# Patient Record
Sex: Female | Born: 1965
Health system: Southern US, Community
[De-identification: ages and names within clinical notes are randomized; demographics above are authoritative.]

## PROBLEM LIST (undated history)

## (undated) DIAGNOSIS — K529 Noninfective gastroenteritis and colitis, unspecified: Secondary | ICD-10-CM

## (undated) DIAGNOSIS — E7800 Pure hypercholesterolemia, unspecified: Secondary | ICD-10-CM

## (undated) DIAGNOSIS — K5100 Ulcerative (chronic) pancolitis without complications: Secondary | ICD-10-CM

## (undated) DIAGNOSIS — K589 Irritable bowel syndrome without diarrhea: Secondary | ICD-10-CM

## (undated) DIAGNOSIS — Z8249 Family history of ischemic heart disease and other diseases of the circulatory system: Secondary | ICD-10-CM

## (undated) HISTORY — DX: Noninfective gastroenteritis and colitis, unspecified: K52.9

## (undated) HISTORY — PX: PARTIAL HYSTERECTOMY: SHX80

## (undated) HISTORY — DX: Family history of ischemic heart disease and other diseases of the circulatory system: Z82.49

## (undated) HISTORY — DX: Ulcerative (chronic) pancolitis without complications: K51.00

## (undated) HISTORY — PX: TONSILLECTOMY: SHX5217

## (undated) HISTORY — DX: Pure hypercholesterolemia, unspecified: E78.00

## (undated) HISTORY — DX: Irritable bowel syndrome, unspecified: K58.9

---

## 1997-08-07 ENCOUNTER — Other Ambulatory Visit: Admission: RE | Admit: 1997-08-07 | Discharge: 1997-08-07 | Payer: Self-pay | Admitting: Obstetrics and Gynecology

## 1998-02-11 ENCOUNTER — Other Ambulatory Visit: Admission: RE | Admit: 1998-02-11 | Discharge: 1998-02-11 | Payer: Self-pay | Admitting: Obstetrics and Gynecology

## 1998-08-13 ENCOUNTER — Other Ambulatory Visit: Admission: RE | Admit: 1998-08-13 | Discharge: 1998-08-13 | Payer: Self-pay | Admitting: Obstetrics and Gynecology

## 1999-01-31 ENCOUNTER — Other Ambulatory Visit: Admission: RE | Admit: 1999-01-31 | Discharge: 1999-01-31 | Payer: Self-pay | Admitting: Obstetrics and Gynecology

## 1999-08-19 ENCOUNTER — Other Ambulatory Visit: Admission: RE | Admit: 1999-08-19 | Discharge: 1999-08-19 | Payer: Self-pay | Admitting: Obstetrics and Gynecology

## 2000-02-05 ENCOUNTER — Other Ambulatory Visit: Admission: RE | Admit: 2000-02-05 | Discharge: 2000-02-05 | Payer: Self-pay | Admitting: Obstetrics and Gynecology

## 2000-03-04 ENCOUNTER — Encounter (INDEPENDENT_AMBULATORY_CARE_PROVIDER_SITE_OTHER): Payer: Self-pay | Admitting: Specialist

## 2000-03-04 ENCOUNTER — Other Ambulatory Visit: Admission: RE | Admit: 2000-03-04 | Discharge: 2000-03-04 | Payer: Self-pay | Admitting: Obstetrics and Gynecology

## 2000-06-17 ENCOUNTER — Encounter (INDEPENDENT_AMBULATORY_CARE_PROVIDER_SITE_OTHER): Payer: Self-pay | Admitting: Specialist

## 2000-06-18 ENCOUNTER — Inpatient Hospital Stay (HOSPITAL_COMMUNITY): Admission: RE | Admit: 2000-06-18 | Discharge: 2000-06-19 | Payer: Self-pay | Admitting: Obstetrics and Gynecology

## 2001-09-06 ENCOUNTER — Other Ambulatory Visit: Admission: RE | Admit: 2001-09-06 | Discharge: 2001-09-06 | Payer: Self-pay | Admitting: Obstetrics and Gynecology

## 2002-10-13 ENCOUNTER — Other Ambulatory Visit: Admission: RE | Admit: 2002-10-13 | Discharge: 2002-10-13 | Payer: Self-pay | Admitting: Obstetrics and Gynecology

## 2003-04-13 ENCOUNTER — Other Ambulatory Visit: Admission: RE | Admit: 2003-04-13 | Discharge: 2003-04-13 | Payer: Self-pay | Admitting: Obstetrics and Gynecology

## 2003-11-27 ENCOUNTER — Other Ambulatory Visit: Admission: RE | Admit: 2003-11-27 | Discharge: 2003-11-27 | Payer: Self-pay | Admitting: Obstetrics and Gynecology

## 2004-06-04 ENCOUNTER — Other Ambulatory Visit: Admission: RE | Admit: 2004-06-04 | Discharge: 2004-06-04 | Payer: Self-pay | Admitting: Obstetrics and Gynecology

## 2005-01-09 ENCOUNTER — Other Ambulatory Visit: Admission: RE | Admit: 2005-01-09 | Discharge: 2005-01-09 | Payer: Self-pay | Admitting: Obstetrics and Gynecology

## 2006-05-04 ENCOUNTER — Ambulatory Visit: Payer: Self-pay | Admitting: Internal Medicine

## 2006-12-01 ENCOUNTER — Ambulatory Visit: Payer: Self-pay | Admitting: Internal Medicine

## 2007-04-01 ENCOUNTER — Encounter: Payer: Self-pay | Admitting: Internal Medicine

## 2007-04-01 DIAGNOSIS — J309 Allergic rhinitis, unspecified: Secondary | ICD-10-CM | POA: Insufficient documentation

## 2007-04-01 DIAGNOSIS — R87619 Unspecified abnormal cytological findings in specimens from cervix uteri: Secondary | ICD-10-CM | POA: Insufficient documentation

## 2007-04-01 DIAGNOSIS — J45909 Unspecified asthma, uncomplicated: Secondary | ICD-10-CM | POA: Insufficient documentation

## 2007-07-29 ENCOUNTER — Encounter: Payer: Self-pay | Admitting: Internal Medicine

## 2008-01-25 ENCOUNTER — Ambulatory Visit: Payer: Self-pay | Admitting: Internal Medicine

## 2008-02-06 ENCOUNTER — Telehealth: Payer: Self-pay | Admitting: Internal Medicine

## 2008-05-07 ENCOUNTER — Ambulatory Visit: Payer: Self-pay | Admitting: Endocrinology

## 2008-05-23 ENCOUNTER — Telehealth: Payer: Self-pay | Admitting: Internal Medicine

## 2008-05-26 ENCOUNTER — Ambulatory Visit: Payer: Self-pay | Admitting: Internal Medicine

## 2008-05-28 ENCOUNTER — Encounter: Admission: RE | Admit: 2008-05-28 | Discharge: 2008-05-28 | Payer: Self-pay | Admitting: Obstetrics and Gynecology

## 2009-02-23 ENCOUNTER — Ambulatory Visit: Payer: Self-pay | Admitting: Family Medicine

## 2009-06-08 ENCOUNTER — Ambulatory Visit: Payer: Self-pay | Admitting: Family Medicine

## 2009-09-16 ENCOUNTER — Encounter: Admission: RE | Admit: 2009-09-16 | Discharge: 2009-09-16 | Payer: Self-pay | Admitting: Obstetrics and Gynecology

## 2009-12-13 ENCOUNTER — Ambulatory Visit: Payer: Self-pay | Admitting: Internal Medicine

## 2010-01-18 ENCOUNTER — Ambulatory Visit: Payer: Self-pay | Admitting: Internal Medicine

## 2010-04-13 ENCOUNTER — Encounter: Payer: Self-pay | Admitting: Obstetrics and Gynecology

## 2010-04-22 NOTE — Assessment & Plan Note (Signed)
Summary: SINUS CONGEST--EAR ACHE--DR AVP PT--STC   Vital Signs:  Patient profile:   45 year old female Weight:      124 pounds Temp:     98.3 degrees F oral BP sitting:   96 / 70  (left arm)  Vitals Entered By: Doristine Devoid (June 08, 2009 10:31 AM) CC: sinus congestion and pressure    History of Present Illness: Acute visit. Patient is seen with several day history of progressive sinus congestion, yellowish nasal discharge, facial pain mostly maxillary sinus region, intermittent sore throat and headache and possible low grade fevers intermittently. Over past few days has developed productive cough. History of frequent sinusitis in past. Over-the-counter medications without much relief. Nonsmoker  Allergies: 1)  ! Hydrocodone 2)  Codeine Phosphate (Codeine Phosphate)  Past History:  Past Medical History: Last updated: 04/01/2007 Allergic rhinitis Asthma PMH reviewed for relevance  Review of Systems      See HPI  Physical Exam  General:  Well-developed,well-nourished,in no acute distress; alert,appropriate and cooperative throughout examination Ears:  External ear exam shows no significant lesions or deformities.  Otoscopic examination reveals clear canals, tympanic membranes are intact bilaterally without bulging, retraction, inflammation or discharge. Hearing is grossly normal bilaterally. Nose:  External nasal examination shows no deformity or inflammation. Nasal mucosa are pink and moist without lesions or exudates. Mouth:  minimal erythema of the uvula otherwise no acute change Neck:  No deformities, masses, or tenderness noted. Lungs:  Normal respiratory effort, chest expands symmetrically. Lungs are clear to auscultation, no crackles or wheezes. Heart:  Normal rate and regular rhythm. S1 and S2 normal without gallop, murmur, click, rub or other extra sounds.   Impression & Recommendations:  Problem # 1:  SINUSITIS- ACUTE-NOS (ICD-461.9)  The following medications  were removed from the medication list:    Amoxicillin 500 Mg Tabs (Amoxicillin) .Marland Kitchen... 2 tab by mouth two times a day  x 10 days  may start if not improving in 2-3 days. void after 03/2009 Her updated medication list for this problem includes:    Amoxicillin-pot Clavulanate 875-125 Mg Tabs (Amoxicillin-pot clavulanate) ..... One by mouth two times a day for 10 days  Complete Medication List: 1)  Ventolin Hfa 108 (90 Base) Mcg/act Aers (Albuterol sulfate) .Marland Kitchen.. 1-2 puffs q6h for wheezing 2)  Amoxicillin-pot Clavulanate 875-125 Mg Tabs (Amoxicillin-pot clavulanate) .... One by mouth two times a day for 10 days  Patient Instructions: 1)  Acute sinusitis symptoms for less than 10 days are not helped by antibiotics. Use warm moist compresses, and over the counter decongestants( only as directed). Call if no improvement in 5-7 days, sooner if increasing pain, fever, or new symptoms.  Prescriptions: AMOXICILLIN-POT CLAVULANATE 875-125 MG TABS (AMOXICILLIN-POT CLAVULANATE) one by mouth two times a day for 10 days  #20 x 0   Entered and Authorized by:   Evelena Peat MD   Signed by:   Evelena Peat MD on 06/08/2009   Method used:   [facility] to        [facility] [address] [phone]* (retail)       [address]       [place], [place]  [address]       Ph: [phone] or [phone]       Fax: [phone]   RxID:   [id number]

## 2010-04-22 NOTE — Assessment & Plan Note (Signed)
Summary: SINUS CONGESTION...AS.   Vital Signs:  Patient profile:   45 year old female Height:      63 inches Weight:      125 pounds BMI:     22.22 O2 Sat:      96 % Temp:     98.5 degrees F Pulse rate:   83 / minute Resp:     14 per minute BP sitting:   116 / 70  (left arm) Cuff size:   regular  Vitals Entered By: Jarome Lamas (January 18, 2010 11:07 AM) CC: congestion/pb   History of Present Illness: having the "same old thing"  has sinus congestion and pressure started about 3 days ago tends to "hit me very hard"  Allergies are mold and mold spores tends to flare this time of year doesn't take regular meds---cetirizine as needed   No fever Slight cough tickle and itching in throat from PND Mild right ear pain  No wheezing No SOB asthma does seem quiet still  Current Medications (verified): 1)  Ventolin Hfa 108 (90 Base) Mcg/act Aers (Albuterol Sulfate) .Marland Kitchen.. 1-2 Puffs Q6h For Wheezing  Allergies (verified): 1)  ! Hydrocodone 2)  Codeine Phosphate (Codeine Phosphate)  Past History:  Past medical, surgical, family and social histories (including risk factors) reviewed for relevance to current acute and chronic problems.  Past Medical History: Reviewed history from 04/01/2007 and no changes required. Allergic rhinitis Asthma  Past Surgical History: Reviewed history from 01/25/2008 and no changes required. Tonsillectomy  Family History: Reviewed history and no changes required.  Social History: Reviewed history from 05/07/2008 and no changes required. Occupation:   Married Never Smoked Alcohol use-no Drug use-no Regular exercise-yes  Review of Systems       No vomiting  No diarrhea eating okay  Physical Exam  General:  alert.  NAD Head:  mild maxillary and frontal tenderness Ears:  R ear normal and L ear normal.   Nose:  moderate inflammation--more on left Mouth:  very slight pharyngeal injeciton without exudates Neck:  supple, no  masses, and no cervical lymphadenopathy.   Lungs:  normal respiratory effort, no intercostal retractions, no accessory muscle use, normal breath sounds, no crackles, and no wheezes.     Impression & Recommendations:  Problem # 1:  SINUSITIS- ACUTE-NOS (ICD-461.9) Assessment Comment Only  seems to get sick quickly will treat with amoxil  Her updated medication list for this problem includes:    Amoxicillin 500 Mg Tabs (Amoxicillin) .Marland Kitchen... 2 tabs by mouth two times a day for sinus infection    Fluticasone Propionate 50 Mcg/act Susp (Fluticasone propionate) .Marland Kitchen... 2 sprays in each nostril daily for allergies  Problem # 2:  ALLERGIC RHINITIS (ICD-477.9) Assessment: Comment Only  seems to trigger sinus infections will have her try fluticasone spray now and as needed during allergy times  Her updated medication list for this problem includes:    Fluticasone Propionate 50 Mcg/act Susp (Fluticasone propionate) .Marland Kitchen... 2 sprays in each nostril daily for allergies  Problem # 3:  ASTHMA (ICD-493.90) Assessment: Comment Only mild and intermittent without exacerbation  Her updated medication list for this problem includes:    Ventolin Hfa 108 (90 Base) Mcg/act Aers (Albuterol sulfate) .Marland Kitchen... 1-2 puffs q6h for wheezing  Complete Medication List: 1)  Ventolin Hfa 108 (90 Base) Mcg/act Aers (Albuterol sulfate) .Marland Kitchen.. 1-2 puffs q6h for wheezing 2)  Amoxicillin 500 Mg Tabs (Amoxicillin) .... 2 tabs by mouth two times a day for sinus infection 3)  Fluticasone Propionate  50 Mcg/act Susp (Fluticasone propionate) .... 2 sprays in each nostril daily for allergies  Patient Instructions: 1)  Please schedule a follow-up appointment as needed .  Prescriptions: FLUTICASONE PROPIONATE 50 MCG/ACT SUSP (FLUTICASONE PROPIONATE) 2 sprays in each nostril daily for allergies  #1 x 5   Entered and Authorized by:   Cindee Salt MD   Signed by:   Cindee Salt MD on 01/18/2010   Method used:    [facility]ronically to        [facility] [address] [phone]* (retail)       [address]       [place], [place]  [address]       Ph: [phone] or [phone]       Fax: [phone]   RxID:   [id number] AMOXICILLIN 500 MG TABS (AMOXICILLIN) 2 tabs by mouth two times a day for sinus infection  #40 x 0   Entered and Authorized by:   Cindee Salt MD   Signed by:   Cindee Salt MD on 01/18/2010   Method used:   [facility]ronically to        [facility] [address] [phone]* (retail)       [address]       [place], [place]  [address]       Ph: [phone] or [phone]       Fax: [phone]   RxID:   [id number]    Orders Added: 1)  Est. Patient Level IV [89381]

## 2010-04-22 NOTE — Assessment & Plan Note (Signed)
Summary: FLU SHOT/NWS  Nurse Visit   Allergies: 1)  ! Hydrocodone 2)  Codeine Phosphate (Codeine Phosphate)  Orders Added: 1)  Admin 1st Vaccine [90471] 2)  Flu Vaccine 104yrs + [16109] Flu Vaccine Consent Questions     Do you have a history of severe allergic reactions to this vaccine? no    Any prior history of allergic reactions to egg and/or gelatin? no    Do you have a sensitivity to the preservative Thimersol? no    Do you have a past history of Guillan-Barre Syndrome? no    Do you currently have an acute febrile illness? no    Have you ever had a severe reaction to latex? no    Vaccine information given and explained to patient? yes    Are you currently pregnant? no    Lot Number:[id number]   Exp Date:09/20/2010   Site Given  Left Deltoid IM .lbflu

## 2010-08-08 NOTE — H&P (Signed)
[facility]  Patient:    Megan Medina, Megan Medina                 MRN: [id number] Adm. Date:  06/17/00 Attending:  Juluis Mire, M.D.                         History and Physical  CHIEF COMPLAINT:              The patient is a 45 year old gravida 2 para 2 married white female, who presents for laparoscopically assisted vaginal hysterectomy for management of recurrent cervical dysplasia.  HISTORY OF PRESENT ILLNESS:   In relation to the present admission, the patient has had long-standing history of abnormal cervical cytology.  Starting back in 1992 she was found to have mild cervical dysplasia with associated HPV, for which she underwent cryotherapy.  Because of recurrent issues this required a repeat freezing soon after.  She subsequently did quite well until she had recurrent cervical dysplasia again in 1998, and she underwent loop excision procedure in the office with finding of recurrent mild dysplasia. Again we had a time where she did well and then she had recurrent abnormal cervical cytology suggestive of mild dysplasia in December 2001.  Colposcopy was not satisfactory and in view of this we were considering repeat LEEP after discussion of other options due to the persistent and recurrent nature of her process, and she now presents for laparoscopically assisted vaginal hysterectomy.                                It is also noted during this time she has had significant dysmenorrhea requiring the use of Darvocet, and so certainly there is the potential for endometriosis.  She has had a previous bilateral tubal ligation.  No pelvic abnormalities were noted at that time in 1989.  ALLERGIES:                    CODEINE.  CURRENT MEDICATIONS:          None.  PAST MEDICAL HISTORY:         Usual childhood diseases.  No significant sequelae.  PAST OBSTETRICAL HISTORY:     She has had two vaginal deliveries.  PAST SURGICAL HISTORY:        1.  Tubal done via laparoscope.                               2. Tonsillectomy and adenoidectomy at age 66.  FAMILY HISTORY:               Noncontributory.  SOCIAL HISTORY:               No tobacco or alcohol use.  REVIEW OF SYSTEMS:            Noncontributory.  PHYSICAL EXAMINATION:  VITAL SIGNS:                  The patient is afebrile with stable vital signs.  HEENT:                        Head normocephalic.  PERRLA.  EOMI.  Sclerae and conjunctivae clear.  Oropharynx clear.  NECK:  Without thyromegaly.  BREAST:                       No discrete masses.  LUNGS:                        Clear.  CARDIOVASCULAR:               Regular rate and rhythm.  No murmurs, rubs, or gallops.  ABDOMEN:                      Benign.  PELVIC:                       Normal external genitalia.  Vaginal mucosa clear.  Cervix appears normal.  Uterus normal in size, shape, and contour. Adnexa free of masses or tenderness.  EXTREMITIES:                  Trace edema.  NEUROLOGIC:                   Grossly within normal limits.  IMPRESSION:                   1. Recurrent cervical dysplasia, status post                                  multiple therapies.                               2. Significant dysmenorrhea.  PLAN:                         The patient is to undergo laparoscopically assisted vaginal hysterectomy for management of recurrent cervical dysplasia as well as dysmenorrhea.  The risks of surgery have been discussed including the risks of anesthesia, the risk of infection, the risk of hemorrhage that could necessitate transfusion, transfusion risk of AIDS or hepatitis, risk of injury to adjacent organs including bladder, bowel, or ureters that could require further exploratory surgery, risk of deep vein thrombosis and pulmonary embolus.  The patient expressed understanding of the indications and risks.  We also discussed the other option of repeat LEEP procedure of  the cervix for management. DD:  06/17/00 TD:  06/17/00 Job: [id number]

## 2010-08-08 NOTE — Op Note (Signed)
[facility]  Patient:    Megan Medina, Megan Medina               MRN: [id number] Proc. Date: 06/17/00 Adm. Date:  [id number] Attending:  Frederich Balding                           Operative Report  PREOPERATIVE DIAGNOSES:       1. Recurrent cervical dysplasia despite                                  repetitive in-office treatments.                               2. Significant dysmenorrhea.  POSTOPERATIVE DIAGNOSES:      1. Recurrent cervical dysplasia despite                                  repetitive in-office treatments.                               2. Significant dysmenorrhea.  PROCEDURE:                    Laparoscopic assisted vaginal hysterectomy.  SURGEON:                      Juluis Mire, M.D.  ASSISTANT:                    Raynald Kemp, M.D.  ANESTHESIA:                   General endotracheal.  ESTIMATED BLOOD LOSS:         300-400 cc.  PACkS AND DRAINS:             None.  INTRAOPERATIVE BLOOD REPLACEMENT:                  None.  COMPLICATIONS:                None.  INDICATIONS:                  Dictated in history and physical.  DESCRIPTION OF PROCEDURE:     The patient was taken to the OR and placed in the supine position. After satisfactory level of general endotracheal anesthesia obtained, the patient was placed in the dorsal lithotomy position using the Allen stirrups. The abdomen, perineum, and vagina were prepped out with Betadine. Examination under anesthesia revealed the uterus to be normal size and shape, adnexa unremarkable. A speculum was placed in the vaginal vault. A Hulka tenaculum was put in place and secured. The speculum was then removed. The bladder was emptied by in-and-out catheterization. The patient was draped out for laparoscopy. A subumbilical incision was made with a knife. The Veress needle was introduced into the abdominal cavity. The abdomen was insufflated with approximately 3 liters of carbon  dioxide. The operating laparoscope was introduced. There was no evidence of injury to adjacent organs. A 5 mm trocar was put in place in the suprapubic area under direct visualization. The uterus was upper limits of normal size. Tubes and ovaries were unremarkable.  She had a corpus luteum cyst on the left side. Appendix was visualized and noted to be normal. Upper abdomen including liver and tip of the gallbladder were ______ . Using the bipolar and scissors, the left round ligament was cauterized on the sides. The left tube and mesosalpinx were cauterized and incised and the left utero-ovarian pedicle was cauterized and incised. Then the right round ligament was cauterized and incised. The right tube and mesosalpinx were cauterized and incised and the right utero-ovarian pedicle was cauterized and incised. We had good hemostasis and complete freeing of the uterus. At this point in time the abdomen was de-inflated of its carbon dioxide, the laparoscope was then removed.  The patients legs were repositioned. The Hulka tenaculum was then removed. The weighted speculum was placed in the vaginal vault. The cervix was grasped with a Christella Hartigan tenaculum. The cul-de-sac was entered sharply. Both uterosacral ligaments were clamped, cut, and suture ligated with 0 Vicryl. The reflection of the vaginal mucosa anteriorly was incised and dissected superiorly. Paracervical tissue was clamped, cut, and suture ligated with 0 Vicryl. Uterine vessels were clamped, cut, and suture ligated with 0 Vicryl. The vesicouterine space was entered and retractors put in place to retract the bladder superiorly. Using the clamp, cut, and tie technique, with sutures such as 0 Vicryl the parametrium and cervix separated from the sides of the uterus. The uterus was then flipped. The remaining pedicles were clamped, cut, and the uterus passed off the operative field. Held pedicles were secured with free ties of 0 Vicryl. A  pumper was noted from the left vaginal cuff that was clamped with the Heaney and suture ligated with 0 Vicryl. The posterior vaginal cuff was then run with interlocking suture of 0 Vicryl. The vaginal mucosa was reapproximated in midline with interrupted figure-of-eights of 0 Vicryl. We had good reapproximation in the vaginal cuff and hemostasis. All held sutures were cut. A sponge on a sponge stick was placed in the vaginal vault and the weighted speculum then removed. The Foley was placed to straight drainage returned with clear urine.  The legs were repositioned. The abdomen was reinflated with carbon dioxide. Visualization did reveal one pumper coming from the right uterosacral ligament area. This was cauterized. With this we had excellent hemostasis throughout the pelvis. We thoroughly irrigated the pelvis. No active bleeding was noted at this point in time. Small amounts of oozing are controlled with the bipolar. At this point in time the abdomen was deflated of its carbon dioxide. All trocars removed. Subumbilical incision closed in with subcuticular of 4-0 Vicryl. The suprapubic incision was closed with Steri-Strips. The sponge on sponge stick was manually removed from the vaginal vault. The patient was taken out of the dorsal supine position alert and extubated, transferred to recovery room in good condition. All sponge, instrument, and needle count reported as correct by circulating nurse x 2. DD:  06/17/00 TD:  06/17/00 Job: [id number]

## 2010-08-08 NOTE — Discharge Summary (Signed)
[facility]  Patient:    Megan Medina, Megan Medina               MRN: [id number] Adm. Date:  [id number] Disc. Date: [id number] Attending:  Frederich Balding                           Discharge Summary  ADMITTING DIAGNOSES:          1. Recurrent cervical dysplasia despite                                  conservative treatments.                               2. Significant dysmenorrhea.  DISCHARGE DIAGNOSES:          1. Recurrent cervical dysplasia despite                                  conservative treatments.                               2. Significant dysmenorrhea.  OPERATIVE PROCEDURE:          Laparoscopic assisted vaginal hysterectomy.  HISTORY AND PHYSICAL:         For complete history and physical please see dictated note.  HOSPITAL COURSE:              The patient underwent laparoscopic assisted vaginal hysterectomy. Pathology is still pending. Postoperatively, the patient did well. Postoperative hemoglobin is 12.3. The patient was discharged on her second postoperative day. At that time she was afebrile with stable vital signs, abdomen was soft and nontender, bowel sounds were active. She was passing flatus. She did have a subumbilical and suprapubic incision that were intact. She was voiding without difficulty. Minimal bleeding was noted. She was maintained for one extra day due to postoperative nausea.  In terms of complications, none were encountered during stay in the hospital. The patient was discharged in stable condition.  DISPOSITION:                  The patient is to be reevaluated in the office in one week.  DISCHARGE INSTRUCTIONS:       She is to avoid heavy lifting, vaginal entry, and driving a car. She is to watch for signs of infection, increasing nausea and vomiting, abdominal pain, or active bleeding.  DISCHARGE MEDICATIONS:        The patient is discharged home with Demerol as as needed for pain. DD:   06/19/00 TD:  06/20/00 Job: [id number]

## 2010-09-03 ENCOUNTER — Telehealth: Payer: Self-pay | Admitting: *Deleted

## 2010-09-03 NOTE — Telephone Encounter (Signed)
Patient requesting letter from MD to excuse her from jury duty due to IBS. She says Dr Corinda Gubler wrote letter in 2006 for same reason. She will drop off summons request.

## 2010-09-04 NOTE — Telephone Encounter (Signed)
When did I see her last? Thx

## 2010-09-04 NOTE — Telephone Encounter (Signed)
I will not be able to produce a letter. One of the reasons - no OV in a long time. Thx

## 2010-09-04 NOTE — Telephone Encounter (Signed)
Must have been before 2008. Checked centricity - she was seen by Dr Yetta Barre in 2009 and 5 other times - ALL sat clinic, last being 2011, ALL for sinus problems.

## 2010-09-05 NOTE — Telephone Encounter (Signed)
Pt informed

## 2010-09-18 ENCOUNTER — Encounter: Payer: Self-pay | Admitting: Internal Medicine

## 2010-09-23 ENCOUNTER — Ambulatory Visit (INDEPENDENT_AMBULATORY_CARE_PROVIDER_SITE_OTHER): Payer: 59 | Admitting: Internal Medicine

## 2010-09-23 ENCOUNTER — Encounter: Payer: Self-pay | Admitting: Internal Medicine

## 2010-09-23 ENCOUNTER — Other Ambulatory Visit (INDEPENDENT_AMBULATORY_CARE_PROVIDER_SITE_OTHER): Payer: 59

## 2010-09-23 VITALS — BP 100/70 | HR 88 | Temp 98.5°F | Resp 16 | Ht 63.0 in | Wt 134.0 lb

## 2010-09-23 DIAGNOSIS — J309 Allergic rhinitis, unspecified: Secondary | ICD-10-CM

## 2010-09-23 DIAGNOSIS — Z0000 Encounter for general adult medical examination without abnormal findings: Secondary | ICD-10-CM

## 2010-09-23 DIAGNOSIS — K589 Irritable bowel syndrome without diarrhea: Secondary | ICD-10-CM

## 2010-09-23 LAB — URINALYSIS
Leukocytes, UA: NEGATIVE
Nitrite: NEGATIVE
pH: 7 (ref 5.0–8.0)

## 2010-09-23 LAB — CBC WITH DIFFERENTIAL/PLATELET
Basophils Absolute: 0 10*3/uL (ref 0.0–0.1)
Eosinophils Relative: 0.8 % (ref 0.0–5.0)
MCV: 88.3 fl (ref 78.0–100.0)
Monocytes Absolute: 0.5 10*3/uL (ref 0.1–1.0)
Neutrophils Relative %: 60.2 % (ref 43.0–77.0)
Platelets: 243 10*3/uL (ref 150.0–400.0)
RBC: 4.59 Mil/uL (ref 3.87–5.11)
RDW: 12.2 % (ref 11.5–14.6)
WBC: 6.5 10*3/uL (ref 4.5–10.5)

## 2010-09-23 LAB — COMPREHENSIVE METABOLIC PANEL
ALT: 21 U/L (ref 0–35)
Albumin: 4.2 g/dL (ref 3.5–5.2)
Chloride: 104 mEq/L (ref 96–112)
Glucose, Bld: 81 mg/dL (ref 70–99)
Potassium: 4.3 mEq/L (ref 3.5–5.1)
Sodium: 140 mEq/L (ref 135–145)

## 2010-09-23 MED ORDER — LOPERAMIDE HCL 2 MG PO TABS: 2.0000 mg | ORAL_TABLET | Freq: Four times a day (QID) | ORAL | Status: DC | PRN

## 2010-09-23 NOTE — Assessment & Plan Note (Signed)
On Rx 

## 2010-09-23 NOTE — Progress Notes (Signed)
  Subjective:    Patient ID: Megan Medina, female    DOB: 1965/09/05, 45 y.o.   MRN: [id number]  HPI  F/u IBS, allergies  Review of Systems  Constitutional: Negative for chills and fatigue.  Gastrointestinal: Positive for abdominal pain and diarrhea. Negative for blood in stool.  Musculoskeletal: Negative for back pain.       Objective:   Physical Exam  Constitutional: She appears well-developed and well-nourished. No distress.  HENT:  Head: Normocephalic.  Right Ear: External ear normal.  Left Ear: External ear normal.  Nose: Nose normal.  Mouth/Throat: Oropharynx is clear and moist.  Eyes: Conjunctivae are normal. Pupils are equal, round, and reactive to light. Right eye exhibits no discharge. Left eye exhibits no discharge.  Neck: Normal range of motion. Neck supple. No JVD present. No tracheal deviation present. No thyromegaly present.  Cardiovascular: Normal rate, regular rhythm and normal heart sounds.   Pulmonary/Chest: No stridor. No respiratory distress. She has no wheezes.  Abdominal: Soft. Bowel sounds are normal. She exhibits no distension and no mass. There is no tenderness. There is no rebound and no guarding.  Musculoskeletal: She exhibits no edema and no tenderness.  Lymphadenopathy:    She has no cervical adenopathy.  Neurological: She displays normal reflexes. No cranial nerve deficit. She exhibits normal muscle tone. Coordination normal.  Skin: No rash noted. No erythema.  Psychiatric: She has a normal mood and affect. Her behavior is normal. Judgment and thought content normal.          Assessment & Plan:

## 2010-09-23 NOTE — Assessment & Plan Note (Signed)
Jury duty excuse letter Imodium prn

## 2010-09-25 ENCOUNTER — Telehealth: Payer: Self-pay | Admitting: *Deleted

## 2010-09-25 NOTE — Telephone Encounter (Signed)
Pt informed jury duty excuse letter is ready to p/u.

## 2011-03-18 ENCOUNTER — Encounter: Payer: Self-pay | Admitting: Internal Medicine

## 2011-03-18 ENCOUNTER — Ambulatory Visit (INDEPENDENT_AMBULATORY_CARE_PROVIDER_SITE_OTHER): Payer: 59 | Admitting: Internal Medicine

## 2011-03-18 VITALS — BP 112/80 | HR 78 | Temp 97.9°F

## 2011-03-18 DIAGNOSIS — J321 Chronic frontal sinusitis: Secondary | ICD-10-CM

## 2011-03-18 DIAGNOSIS — J309 Allergic rhinitis, unspecified: Secondary | ICD-10-CM

## 2011-03-18 MED ORDER — CETIRIZINE-PSEUDOEPHEDRINE ER 5-120 MG PO TB12: 1.0000 | ORAL_TABLET | Freq: Two times a day (BID) | ORAL | Status: AC

## 2011-03-18 MED ORDER — FLUTICASONE PROPIONATE 50 MCG/ACT NA SUSP: 2.0000 | Freq: Every day | NASAL | Status: DC

## 2011-03-18 MED ORDER — AMOXICILLIN 500 MG PO TABS: 500.0000 mg | ORAL_TABLET | Freq: Two times a day (BID) | ORAL | Status: DC

## 2011-03-18 NOTE — Progress Notes (Signed)
  Subjective:    HPI  complains of head cold symptoms  Onset 48h ago, progressive congestion symptoms  associated with rhinorrhea, sneezing, sore throat, mild headache and frontal sinus pressure - Not associated with fever, myalgias, or chest congestion - dry cough with pnd No relief with OTC meds Precipitated by sick contacts  Past Medical History  Diagnosis Date  . Asthma   . Allergic rhinitis   . IBS (irritable bowel syndrome)     Review of Systems Constitutional: No fever or night sweats, no unexpected weight change Pulmonary: No pleurisy or hemoptysis Cardiovascular: No chest pain or palpitations     Objective:   Physical Exam BP 112/80  Pulse 78  Temp(Src) 97.9 F (36.6 C) (Oral)  SpO2 98% GEN: nontoxic appearing and audible head congestion HENT: NCAT, mild frontal sinus tenderness bilaterally, nares with clear discharge, oropharynx mild erythema, no exudate Eyes: Vision grossly intact, no conjunctivitis Lungs: Clear to auscultation without rhonchi or wheeze, no increased work of breathing Cardiovascular: Regular rate and rhythm, no bilateral edema      Assessment & Plan:  Viral URI  allg sinusitus Dry cough, postnasal drip related to above   Explained lack of efficacy for antibiotics in viral disease But printed rx for empiric antibiotics given to pt to use if symptom duration greater than 7 days Prescription nasal steroid- new prescriptions done Symptomatic care with Tylenol or Advil, hydration and rest -  continue Zyrtec d and afrin (no more than 5 days duration), and saline irrigation + salt gargle advised as needed

## 2011-03-18 NOTE — Patient Instructions (Signed)
It was good to see you today. If you develop worsening symptoms or fever, you can reconsider antibiotics (printed prescription for amoxicillin given to you today, but it does not appear necessary to use antibiotics at this time. Start Flonase daily for allergy symptoms - continued daily use will help control future flares even if not helping "current symptoms" Your nose spray prescription(s) have been submitted to your pharmacy. Please take as directed and contact our office if you believe you are having problem(s) with the medication(s).

## 2012-01-26 ENCOUNTER — Ambulatory Visit: Payer: 59

## 2012-01-27 ENCOUNTER — Ambulatory Visit (INDEPENDENT_AMBULATORY_CARE_PROVIDER_SITE_OTHER): Payer: 59

## 2012-01-27 DIAGNOSIS — Z23 Encounter for immunization: Secondary | ICD-10-CM

## 2012-05-14 ENCOUNTER — Ambulatory Visit (INDEPENDENT_AMBULATORY_CARE_PROVIDER_SITE_OTHER): Payer: BC Managed Care – PPO | Admitting: Family Medicine

## 2012-05-14 VITALS — BP 110/74 | HR 81 | Temp 97.7°F | Resp 18 | Wt 134.2 lb

## 2012-05-14 DIAGNOSIS — J322 Chronic ethmoidal sinusitis: Secondary | ICD-10-CM

## 2012-05-14 MED ORDER — AMOXICILLIN-POT CLAVULANATE 875-125 MG PO TABS: 1.0000 | ORAL_TABLET | Freq: Two times a day (BID) | ORAL | Status: DC

## 2012-05-14 NOTE — Progress Notes (Signed)
SUBJECTIVE:  Megan Medina is a 47 y.o. female who complains of coryza, congestion and bilateral sinus pain for 8 days. She denies a history of anorexia, chest pain, fatigue and fevers and admits to a history of asthma. Patient denies smoke cigarettes.   Patient Active Problem List  Diagnosis  . ALLERGIC RHINITIS  . ASTHMA  . ABNORMAL PAP SMEAR  . IBS (irritable bowel syndrome)  . Ethmoid sinusitis   Past Medical History  Diagnosis Date  . Asthma   . Allergic rhinitis   . IBS (irritable bowel syndrome)    Past Surgical History  Procedure Laterality Date  . Tonsillectomy     History  Substance Use Topics  . Smoking status: Never Smoker   . Smokeless tobacco: Not on file  . Alcohol Use: No   No family history on file. Allergies  Allergen Reactions  . Codeine Phosphate     REACTION: unspecified  . Hydrocodone    Current Outpatient Prescriptions on File Prior to Visit  Medication Sig Dispense Refill  . albuterol (VENTOLIN HFA) 108 (90 BASE) MCG/ACT inhaler Inhale 1-2 puffs into the lungs every 6 (six) hours as needed.        . fluticasone (FLONASE) 50 MCG/ACT nasal spray Place 2 sprays into the nose daily.  16 g  0  . loperamide (IMODIUM A-D) 2 MG tablet Take 2 mg by mouth 4 (four) times daily as needed.         No current facility-administered medications on file prior to visit.   The PMH, PSH, Social History, Family History, Medications, and allergies have been reviewed in [facility], and have been updated if relevant.  OBJECTIVE: BP 110/74  Pulse 81  Temp(Src) 97.7 F (36.5 C) (Oral)  Resp 18  Wt 134 lb 4 oz (60.895 kg)  BMI 23.79 kg/m2  SpO2 96%  She appears well, vital signs are as noted. Ears normal.  Throat and pharynx normal.  Neck supple. No adenopathy in the neck. Nose is congested. Sinuses tender bilaterally. The chest is clear, without wheezes or rales.  ASSESSMENT:  sinusitis  PLAN: Given duration and progression of symptoms, will treat for  bacterial sinusitis with 10 day course of Augmentin.  Symptomatic therapy suggested: push fluids, rest and return office visit prn if symptoms persist or worsen.  Call or return to clinic prn if these symptoms worsen or fail to improve as anticipated.

## 2012-05-14 NOTE — Patient Instructions (Addendum)
Take antibiotic as directed.  Drink lots of fluids.  Treat sympotmatically with flonase, nasal saline irrigation, and Tylenol/Ibuprofen.You can use warm compresses.  Call if not improving as expected in 5-7 days.

## 2013-01-12 ENCOUNTER — Ambulatory Visit (INDEPENDENT_AMBULATORY_CARE_PROVIDER_SITE_OTHER): Payer: BC Managed Care – PPO

## 2013-01-12 DIAGNOSIS — Z23 Encounter for immunization: Secondary | ICD-10-CM

## 2013-03-18 ENCOUNTER — Ambulatory Visit (INDEPENDENT_AMBULATORY_CARE_PROVIDER_SITE_OTHER): Payer: BC Managed Care – PPO | Admitting: Family Medicine

## 2013-03-18 ENCOUNTER — Encounter: Payer: Self-pay | Admitting: Family Medicine

## 2013-03-18 VITALS — BP 102/78 | Temp 100.0°F | Wt 136.0 lb

## 2013-03-18 DIAGNOSIS — J019 Acute sinusitis, unspecified: Secondary | ICD-10-CM

## 2013-03-18 MED ORDER — AMOXICILLIN-POT CLAVULANATE 875-125 MG PO TABS: 1.0000 | ORAL_TABLET | Freq: Two times a day (BID) | ORAL | Status: DC

## 2013-03-18 NOTE — Progress Notes (Signed)
Pre visit review using our clinic review tool, if applicable. No additional management support is needed unless otherwise documented below in the visit note. 

## 2013-03-18 NOTE — Progress Notes (Signed)
   Subjective:    Patient ID: Megan Medina, female    DOB: [birth year], 47 y.o.   MRN: [id number]  HPI Here for 3 days of sinus pressure and a left sided HA. She has PND and is blowing yellow mucus form the nose. No cough. Using Zyrtec D.   Review of Systems  Constitutional: Positive for fever.  HENT: Positive for congestion, postnasal drip and sinus pressure.   Eyes: Negative.   Respiratory: Negative.        Objective:   Physical Exam  Constitutional: She appears well-developed and well-nourished.  HENT:  Right Ear: External ear normal.  Left Ear: External ear normal.  Nose: Nose normal.  Mouth/Throat: Oropharynx is clear and moist.  Eyes: Conjunctivae are normal.  Pulmonary/Chest: Effort normal and breath sounds normal.  Lymphadenopathy:    She has no cervical adenopathy.          Assessment & Plan:  Drink fluids and use motrin prn

## 2013-10-06 ENCOUNTER — Encounter: Payer: Self-pay | Admitting: Internal Medicine

## 2013-10-06 ENCOUNTER — Ambulatory Visit (INDEPENDENT_AMBULATORY_CARE_PROVIDER_SITE_OTHER): Payer: BC Managed Care – PPO | Admitting: Internal Medicine

## 2013-10-06 VITALS — BP 110/70 | HR 68 | Temp 99.1°F | Resp 16 | Wt 133.0 lb

## 2013-10-06 DIAGNOSIS — J0101 Acute recurrent maxillary sinusitis: Secondary | ICD-10-CM

## 2013-10-06 DIAGNOSIS — J019 Acute sinusitis, unspecified: Secondary | ICD-10-CM | POA: Insufficient documentation

## 2013-10-06 DIAGNOSIS — J0100 Acute maxillary sinusitis, unspecified: Secondary | ICD-10-CM

## 2013-10-06 MED ORDER — AMOXICILLIN-POT CLAVULANATE 875-125 MG PO TABS: 1.0000 | ORAL_TABLET | Freq: Two times a day (BID) | ORAL | Status: DC

## 2013-10-06 NOTE — Progress Notes (Deleted)
Pre visit review using our clinic review tool, if applicable. No additional management support is needed unless otherwise documented below in the visit note. 

## 2013-10-06 NOTE — Progress Notes (Signed)
   Subjective:    Sinusitis This is a new problem. The current episode started in the past 7 days. The problem has been gradually worsening since onset. The pain is mild. Associated symptoms include congestion, coughing and headaches. Pertinent negatives include no chills or shortness of breath. The treatment provided no relief.      Review of Systems  Constitutional: Negative for chills.  HENT: Positive for congestion.   Respiratory: Positive for cough. Negative for shortness of breath.   Neurological: Positive for headaches.       Objective:   Physical Exam  Constitutional: She appears well-developed. No distress.  HENT:  Head: Normocephalic.  Right Ear: External ear normal.  Left Ear: External ear normal.  Nose: Nose normal.  Mouth/Throat: Oropharynx is clear and moist.  Swollen nasal mucosa  Eyes: Conjunctivae are normal. Pupils are equal, round, and reactive to light. Right eye exhibits no discharge. Left eye exhibits no discharge.  Neck: Normal range of motion. Neck supple. No JVD present. No tracheal deviation present. No thyromegaly present.  Cardiovascular: Normal rate, regular rhythm and normal heart sounds.   Pulmonary/Chest: No stridor. No respiratory distress. She has no wheezes.  Abdominal: Soft. Bowel sounds are normal. She exhibits no distension and no mass. There is no tenderness. There is no rebound and no guarding.  Musculoskeletal: She exhibits no edema and no tenderness.  Lymphadenopathy:    She has no cervical adenopathy.  Neurological: She displays normal reflexes. No cranial nerve deficit. She exhibits normal muscle tone. Coordination normal.  Skin: No rash noted. No erythema.  Psychiatric: She has a normal mood and affect. Her behavior is normal. Judgment and thought content normal.          Assessment & Plan:

## 2013-10-06 NOTE — Assessment & Plan Note (Signed)
Start Augmentin 

## 2013-10-16 ENCOUNTER — Telehealth: Payer: Self-pay | Admitting: Internal Medicine

## 2013-10-16 ENCOUNTER — Other Ambulatory Visit: Payer: Self-pay

## 2013-10-16 MED ORDER — ALBUTEROL SULFATE HFA 108 (90 BASE) MCG/ACT IN AERS: 1.0000 | INHALATION_SPRAY | Freq: Four times a day (QID) | RESPIRATORY_TRACT | Status: DC | PRN

## 2013-10-16 NOTE — Telephone Encounter (Signed)
Patient is requesting a prescription for an inhaler to be sent [facility] on [facility]. States that when she saw Dr. Posey ReaPlotnikov last week she thought that she had one at home that was not expired but she does not have one. Patient advised that Dr. Posey ReaPlotnikov is out this week. Will send to last provider she has seen.  Please advise.

## 2014-01-02 ENCOUNTER — Ambulatory Visit: Payer: BC Managed Care – PPO

## 2014-01-02 ENCOUNTER — Ambulatory Visit (INDEPENDENT_AMBULATORY_CARE_PROVIDER_SITE_OTHER): Payer: BC Managed Care – PPO

## 2014-01-02 DIAGNOSIS — Z23 Encounter for immunization: Secondary | ICD-10-CM

## 2014-02-06 ENCOUNTER — Encounter: Payer: Self-pay | Admitting: Internal Medicine

## 2014-02-06 ENCOUNTER — Ambulatory Visit (INDEPENDENT_AMBULATORY_CARE_PROVIDER_SITE_OTHER): Payer: BC Managed Care – PPO | Admitting: Internal Medicine

## 2014-02-06 VITALS — BP 112/70 | HR 88 | Temp 98.6°F | Ht 63.0 in | Wt 143.0 lb

## 2014-02-06 DIAGNOSIS — J0101 Acute recurrent maxillary sinusitis: Secondary | ICD-10-CM

## 2014-02-06 MED ORDER — FLUTICASONE PROPIONATE 50 MCG/ACT NA SUSP: 2.0000 | Freq: Every day | NASAL | Status: DC

## 2014-02-06 MED ORDER — AMOXICILLIN-POT CLAVULANATE 875-125 MG PO TABS: 1.0000 | ORAL_TABLET | Freq: Two times a day (BID) | ORAL | Status: DC

## 2014-02-06 NOTE — Patient Instructions (Signed)
We will send in Augmentin for sinus infection. Take 1 pill twice a day until it's gone.  The other thing that I have sent in his Flonase, you should be using Flonase daily year-round for your allergies. This will be very beneficial to keep you from having sinus infections.  Sinusitis Sinusitis is redness, soreness, and inflammation of the paranasal sinuses. Paranasal sinuses are air pockets within the bones of your face (beneath the eyes, the middle of the forehead, or above the eyes). In healthy paranasal sinuses, mucus is able to drain out, and air is able to circulate through them by way of your nose. However, when your paranasal sinuses are inflamed, mucus and air can become trapped. This can allow bacteria and other germs to grow and cause infection. Sinusitis can develop quickly and last only a short time (acute) or continue over a long period (chronic). Sinusitis that lasts for more than 12 weeks is considered chronic.  CAUSES  Causes of sinusitis include:  Allergies.  Structural abnormalities, such as displacement of the cartilage that separates your nostrils (deviated septum), which can decrease the air flow through your nose and sinuses and affect sinus drainage.  Functional abnormalities, such as when the small hairs (cilia) that line your sinuses and help remove mucus do not work properly or are not present. SIGNS AND SYMPTOMS  Symptoms of acute and chronic sinusitis are the same. The primary symptoms are pain and pressure around the affected sinuses. Other symptoms include:  Upper toothache.  Earache.  Headache.  Bad breath.  Decreased sense of smell and taste.  A cough, which worsens when you are lying flat.  Fatigue.  Fever.  Thick drainage from your nose, which often is green and may contain pus (purulent).  Swelling and warmth over the affected sinuses. DIAGNOSIS  Your health care provider will perform a physical exam. During the exam, your health care provider  may:  Look in your nose for signs of abnormal growths in your nostrils (nasal polyps).  Tap over the affected sinus to check for signs of infection.  View the inside of your sinuses (endoscopy) using an imaging device that has a light attached (endoscope). If your health care provider suspects that you have chronic sinusitis, one or more of the following tests may be recommended:  Allergy tests.  Nasal culture. A sample of mucus is taken from your nose, sent to a lab, and screened for bacteria.  Nasal cytology. A sample of mucus is taken from your nose and examined by your health care provider to determine if your sinusitis is related to an allergy. TREATMENT  Most cases of acute sinusitis are related to a viral infection and will resolve on their own within 10 days. Sometimes medicines are prescribed to help relieve symptoms (pain medicine, decongestants, nasal steroid sprays, or saline sprays).  However, for sinusitis related to a bacterial infection, your health care provider will prescribe antibiotic medicines. These are medicines that will help kill the bacteria causing the infection.  Rarely, sinusitis is caused by a fungal infection. In theses cases, your health care provider will prescribe antifungal medicine. For some cases of chronic sinusitis, surgery is needed. Generally, these are cases in which sinusitis recurs more than 3 times per year, despite other treatments. HOME CARE INSTRUCTIONS   Drink plenty of water. Water helps thin the mucus so your sinuses can drain more easily.  Use a humidifier.  Inhale steam 3 to 4 times a day (for example, sit in the bathroom  with the shower running).  Apply a warm, moist washcloth to your face 3 to 4 times a day, or as directed by your health care provider.  Use saline nasal sprays to help moisten and clean your sinuses.  Take medicines only as directed by your health care provider.  If you were prescribed either an antibiotic or  antifungal medicine, finish it all even if you start to feel better. SEEK IMMEDIATE MEDICAL CARE IF:  You have increasing pain or severe headaches.  You have nausea, vomiting, or drowsiness.  You have swelling around your face.  You have vision problems.  You have a stiff neck.  You have difficulty breathing. MAKE SURE YOU:   Understand these instructions.  Will watch your condition.  Will get help right away if you are not doing well or get worse. Document Released: 03/09/2005 Document Revised: 07/24/2013 Document Reviewed: 03/24/2011 [facility] Patient Information 2015 [place], [place]. This information is not intended to replace advice given to you by your health care provider. Make sure you discuss any questions you have with your health care provider.

## 2014-02-06 NOTE — Progress Notes (Signed)
Pre visit review using our clinic review tool, if applicable. No additional management support is needed unless otherwise documented below in the visit note. 

## 2014-02-07 NOTE — Assessment & Plan Note (Signed)
Start Augmentin, refill Flonase. Instructed her that she should be using the Flonase regularly since her allergies are year-round this would likely decrease the amount of sinus infections that she has. Advised her to keep it by her toothbrush and use it nightly before she goes to bed.

## 2014-02-07 NOTE — Progress Notes (Signed)
   Subjective:    Patient ID: Megan LeatherwoodElizabeth P Dumond, female    DOB: 04-12-1965, 48 y.o.   MRN: [id number]  HPI The patient is a 48 year old female who comes in today for acute sinus infection. She notes that it started 1-2 weeks ago, has been having yellow mucousy drainage since that time, pressure in her face, occasional headaches. She denies fevers in the last 3 days. She notes some drainage down the back of her throat but denies sore throat, denies significant cough. She is occasionally coughing. She is not using Flonase as her PCP suggested.  Review of Systems  Constitutional: Negative for fever, chills, activity change, appetite change, fatigue and unexpected weight change.  HENT: Positive for congestion, ear discharge, ear pain and sinus pressure. Negative for sore throat.   Respiratory: Positive for cough. Negative for chest tightness, shortness of breath and wheezing.   Cardiovascular: Negative for chest pain, palpitations and leg swelling.  Gastrointestinal: Negative.   Neurological: Negative.       Objective:   Physical Exam  Constitutional: She appears well-developed and well-nourished. No distress.  HENT:  Head: Normocephalic and atraumatic.  Right Ear: External ear normal.  Left Ear: External ear normal.  Nose with yellow mucousy crusting, oropharynx with erythema, no purulent discharge. Sinuses tender over the frontal sinuses.  Eyes: EOM are normal.  Neck: Normal range of motion.  Cardiovascular: Normal rate and regular rhythm.   Pulmonary/Chest: Effort normal and breath sounds normal.  Abdominal: Soft. Bowel sounds are normal.  Skin: Skin is warm and dry.   Filed Vitals:   02/06/14 1527  BP: 112/70  Pulse: 88  Temp: 98.6 F (37 C)  TempSrc: Oral  Height: 5\' 3"  (1.6 m)  Weight: 143 lb (64.864 kg)  SpO2: 99%      Assessment & Plan:

## 2014-12-04 ENCOUNTER — Other Ambulatory Visit: Payer: Self-pay | Admitting: Obstetrics and Gynecology

## 2014-12-05 LAB — CYTOLOGY - PAP

## 2015-05-01 ENCOUNTER — Telehealth: Payer: Self-pay | Admitting: Internal Medicine

## 2015-05-01 NOTE — Telephone Encounter (Signed)
Pt called in and said that she is going on a cruise in a few weeks and needs to know if motion sickness meds can be called in?     Best number [phone] [facility] [address]

## 2015-05-02 MED ORDER — MECLIZINE HCL 12.5 MG PO TABS: 12.5000 mg | ORAL_TABLET | Freq: Three times a day (TID) | ORAL | Status: DC | PRN

## 2015-05-02 MED ORDER — SCOPOLAMINE 1 MG/3DAYS TD PT72: 1.0000 | MEDICATED_PATCH | TRANSDERMAL | Status: DC

## 2015-05-02 NOTE — Telephone Encounter (Signed)
Please advise, thanks.

## 2015-05-02 NOTE — Telephone Encounter (Signed)
OK - done Thx 

## 2015-05-02 NOTE — Telephone Encounter (Signed)
Patient will be tanning in the sun and does not want to use a patch---can you please call in meclizine?---please advise, i will call her back

## 2015-05-02 NOTE — Telephone Encounter (Signed)
Ok Thx 

## 2015-05-03 ENCOUNTER — Other Ambulatory Visit: Payer: Self-pay

## 2015-05-03 NOTE — Telephone Encounter (Signed)
i need to know dosage for med---i will send rx in--thanks

## 2015-05-03 NOTE — Telephone Encounter (Signed)
emailed

## 2015-06-24 ENCOUNTER — Telehealth: Payer: Self-pay | Admitting: *Deleted

## 2015-06-24 NOTE — Telephone Encounter (Signed)
Pt left msg on tri[facility] stating [facility] a week ago, and haven't receive response back. Called pt back no answer LMOM will need OV haven't seen md since 2015 must see md for renewal...../l,b

## 2015-06-25 NOTE — Telephone Encounter (Signed)
Pt left msg on triage requesting call back concerning her refill. Wanting to know why refill was denied. Called pt back np answer LMOM she haven't seen md since 2015 need OV to have refill...Raechel Chute/lmb

## 2015-06-28 ENCOUNTER — Ambulatory Visit: Payer: Self-pay | Admitting: Internal Medicine

## 2015-07-08 ENCOUNTER — Ambulatory Visit: Payer: Self-pay | Admitting: Internal Medicine

## 2015-08-04 ENCOUNTER — Ambulatory Visit (HOSPITAL_COMMUNITY)
Admission: EM | Admit: 2015-08-04 | Discharge: 2015-08-04 | Disposition: A | Discharge status: Home / Self Care | Payer: Managed Care, Other (non HMO) | Attending: Emergency Medicine | Admitting: Emergency Medicine

## 2015-08-04 ENCOUNTER — Encounter (HOSPITAL_COMMUNITY): Payer: Self-pay | Admitting: Emergency Medicine

## 2015-08-04 DIAGNOSIS — J069 Acute upper respiratory infection, unspecified: Secondary | ICD-10-CM

## 2015-08-04 MED ORDER — AMOXICILLIN-POT CLAVULANATE 875-125 MG PO TABS: 1.0000 | ORAL_TABLET | Freq: Two times a day (BID) | ORAL | Status: DC

## 2015-08-04 NOTE — Discharge Instructions (Signed)

## 2015-08-04 NOTE — ED Notes (Signed)
[facility] a [facility] of a productive cough and fever that started 4 days ago. The patient stated that she has been able to keep the fever down with Tylenol.

## 2015-08-04 NOTE — ED Provider Notes (Signed)
CSN: [id number]     Arrival date & time 08/04/15  1301 History   First MD Initiated Contact with Patient 08/04/15 1320     Chief Complaint  Patient presents with  . Cough  . Fever   (Consider location/radiation/quality/duration/timing/severity/associated sxs/prior Treatment) HPI History obtained from patient:  Pt presents with the cc of: Cough and fever Duration of symptoms: One week Treatment prior to arrival: Albuterol inhaler and over-the-counter medications Context: Sudden onset of cough associated with fever sputum production of green upper respiratory infections Other symptoms include: Fevers high as 101 Pain score: None FAMILY HISTORY: No family history of cancer SOCIAL HISTORY: Nonsmoker   Past Medical History  Diagnosis Date  . Asthma   . Allergic rhinitis   . IBS (irritable bowel syndrome)    Past Surgical History  Procedure Laterality Date  . Tonsillectomy     History reviewed. No pertinent family history. Social History  Substance Use Topics  . Smoking status: Never Smoker   . Smokeless tobacco: None  . Alcohol Use: No   OB History    No data available     Review of Systems ROS +'ve wheezing, fever, cough  Denies: HEADACHE, NAUSEA, ABDOMINAL PAIN, CHEST PAIN, CONGESTION, DYSURIA, SHORTNESS OF BREATH  Allergies  Codeine phosphate and Hydrocodone  Home Medications   Prior to Admission medications   Medication Sig Start Date End Date Taking? Authorizing Provider  escitalopram (LEXAPRO) 10 MG tablet Take 10 mg by mouth daily. I tab PO daily   Yes Richardean Chimera, MD  albuterol (VENTOLIN HFA) 108 (90 BASE) MCG/ACT inhaler Inhale 1-2 puffs into the lungs every 6 (six) hours as needed. 10/16/13   Newt Lukes, MD  amoxicillin-clavulanate (AUGMENTIN) 875-125 MG per tablet Take 1 tablet by mouth 2 (two) times daily. 02/06/14   Myrlene Broker, MD  amoxicillin-clavulanate (AUGMENTIN) 875-125 MG tablet Take 1 tablet by mouth every 12 (twelve) hours.  08/04/15   Tharon Aquas, PA  fluticasone (FLONASE) 50 MCG/ACT nasal spray Place 2 sprays into both nostrils daily. 02/06/14   Myrlene Broker, MD  meclizine (ANTIVERT) 12.5 MG tablet Take 1-2 tablets (12.5-25 mg total) by mouth 3 (three) times daily as needed for dizziness or nausea. 05/02/15   Tresa Garter, MD   Meds Ordered and Administered this Visit  Medications - No data to display  BP 127/83 mmHg  Pulse 78  Temp(Src) 98.6 F (37 C) (Oral)  Resp 18  SpO2 97% No data found.   Physical Exam NURSES NOTES AND VITAL SIGNS REVIEWED. CONSTITUTIONAL: Well developed, well nourished, no acute distress HEENT: normocephalic, atraumatic EYES: Conjunctiva normal NECK:normal ROM, supple, no adenopathy PULMONARY:No respiratory distress, normal effort ABDOMINAL: Soft, ND, NT BS+, No CVAT MUSCULOSKELETAL: Normal ROM of all extremities,  SKIN: warm and dry without rash PSYCHIATRIC: Mood and affect, behavior are normal  ED Course  Procedures (including critical care time)  Labs Review Labs Reviewed - No data to display  Imaging Review No results found.   Visual Acuity Review  Right Eye Distance:   Left Eye Distance:   Bilateral Distance:    Right Eye Near:   Left Eye Near:    Bilateral Near:       Prescription for Augmentin  MDM   1. Acute URI     Patient is reassured that there are no issues that require transfer to higher level of care at this time or additional tests. Patient is advised to continue home symptomatic treatment. Patient is advised  that if there are new or worsening symptoms to attend the emergency department, contact primary care provider, or return to UC. Instructions of care provided discharged home in stable condition.    THIS NOTE WAS GENERATED USING A VOICE RECOGNITION SOFTWARE PROGRAM. ALL REASONABLE EFFORTS  WERE MADE TO PROOFREAD THIS DOCUMENT FOR ACCURACY.  I have verbally reviewed the discharge instructions with the patient. A  printed AVS was given to the patient.  All questions were answered prior to discharge.      Tharon AquasFrank C Preston Garabedian, PA 08/04/15 1332  Tharon AquasFrank C Siera Beyersdorf, [place] 08/04/15 1435

## 2015-08-12 ENCOUNTER — Ambulatory Visit (HOSPITAL_BASED_OUTPATIENT_CLINIC_OR_DEPARTMENT_OTHER)
Admission: RE | Admit: 2015-08-12 | Discharge: 2015-08-12 | Disposition: A | Discharge status: Home / Self Care | Payer: Managed Care, Other (non HMO) | Source: Ambulatory Visit | Attending: Family | Admitting: Family

## 2015-08-12 ENCOUNTER — Encounter: Payer: Self-pay | Admitting: Family

## 2015-08-12 ENCOUNTER — Ambulatory Visit (INDEPENDENT_AMBULATORY_CARE_PROVIDER_SITE_OTHER): Payer: Managed Care, Other (non HMO) | Admitting: Family

## 2015-08-12 ENCOUNTER — Telehealth: Payer: Self-pay | Admitting: Family

## 2015-08-12 VITALS — BP 124/78 | HR 83 | Temp 98.8°F | Resp 16 | Ht 63.0 in | Wt 156.4 lb

## 2015-08-12 DIAGNOSIS — R05 Cough: Secondary | ICD-10-CM | POA: Diagnosis present

## 2015-08-12 DIAGNOSIS — R059 Cough, unspecified: Secondary | ICD-10-CM

## 2015-08-12 DIAGNOSIS — R918 Other nonspecific abnormal finding of lung field: Secondary | ICD-10-CM | POA: Diagnosis not present

## 2015-08-12 DIAGNOSIS — J189 Pneumonia, unspecified organism: Secondary | ICD-10-CM | POA: Diagnosis not present

## 2015-08-12 MED ORDER — AZITHROMYCIN 250 MG PO TABS: ORAL_TABLET | ORAL | Status: DC

## 2015-08-12 MED ORDER — PREDNISONE 10 MG PO TABS: ORAL_TABLET | ORAL | Status: DC

## 2015-08-12 NOTE — Patient Instructions (Signed)
Complete chest x ray on the first floor. We will contact you with your results and further recommendations.

## 2015-08-12 NOTE — Telephone Encounter (Signed)
Left message for pt to return my call.

## 2015-08-12 NOTE — Assessment & Plan Note (Signed)
CXR is performed and notes the following:   IMPRESSION: Subtle interstitial prominence is lower lobe predominant and worse on the right. In this nonsmoker, findings are suspicious for viral or mycoplasma pneumonia. Chronic asthma could have a similar Appearance.  Will plan rx to cover mycoplasma with Azithromycin. Will also give rx for prednisone to help with her asthma symptoms and hopefully to help with her cough.She cannot take codeine or hydrocodone. Does not tolerate Tessalon. Suggested trial of delsym. Pt will need follow up with Dr. Posey ReaPlotnikov in 1 week. She is instructed to call sooner if symptoms worsen or if symptoms if symptoms do not improve.

## 2015-08-12 NOTE — Telephone Encounter (Signed)
Pt called back and was notified of below. 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 Per verbal from PCP, ok to remain out of work for 2-3 days until cough improves, wash hands frequently, wear mask around others (grandchildren / family) if needed. Pt voices understanding.

## 2015-08-12 NOTE — Progress Notes (Signed)
   Subjective:    Patient ID: Megan Medina, female    DOB: 1965-07-01, 50 y.o.   MRN: [id number]  HPI  Megan Medina is a 50 yr old female who presents today with chief complaint of cough. She reports that cough began 08/01/15. Started with HA, fever, cough. She was evaluated in the ED that day (ED note is reviewed). She was given an Rx for 7 day course of Augmentin at that visit. She completed augmentin without improvement in her symptoms.   Fever has resolved.  Cough is worse.  She has a history of asthma.  Reports that she is using her inhaler more than normal.  Denies sore throat, denies otalgia, just "a little pressure." Reports son and grandson have been sick with URI sxs.   Review of Systems See HPI  Past Medical History  Diagnosis Date  . Asthma   . Allergic rhinitis   . IBS (irritable bowel syndrome)      Social History   Social History  . Marital Status: Married    Spouse Name: N/A  . Number of Children: N/A  . Years of Education: N/A   Occupational History  . Not on file.   Social History Main Topics  . Smoking status: Never Smoker   . Smokeless tobacco: Not on file  . Alcohol Use: No  . Drug Use: No  . Sexual Activity: Yes   Other Topics Concern  . Not on file   Social History Narrative   Regular exercise-yes.    Past Surgical History  Procedure Laterality Date  . Tonsillectomy      No family history on file.  Allergies  Allergen Reactions  . Codeine Phosphate     REACTION: unspecified  . Hydrocodone Nausea And Vomiting    Current Outpatient Prescriptions on File Prior to Visit  Medication Sig Dispense Refill  . albuterol (VENTOLIN HFA) 108 (90 BASE) MCG/ACT inhaler Inhale 1-2 puffs into the lungs every 6 (six) hours as needed. 0.05 g 3  . escitalopram (LEXAPRO) 10 MG tablet Take 10 mg by mouth daily. I tab PO daily     No current facility-administered medications on file prior to visit.    BP 124/78 mmHg  Pulse 83  Temp(Src)  98.8 F (37.1 C) (Oral)  Resp 16  Ht 5\' 3"  (1.6 m)  Wt 156 lb 6.4 oz (70.943 kg)  BMI 27.71 kg/m2  SpO2 98%       Objective:   Physical Exam  Constitutional: She appears well-developed and well-nourished.  HENT:  Head: Normocephalic and atraumatic.  Mouth/Throat: No posterior oropharyngeal edema or posterior oropharyngeal erythema.  Cardiovascular: Normal rate, regular rhythm and normal heart sounds.   No murmur heard. Pulmonary/Chest: Effort normal. No respiratory distress. She has rales in the right lower field.  Lymphadenopathy:    She has no cervical adenopathy.  Psychiatric: She has a normal mood and affect. Her behavior is normal. Judgment and thought content normal.          Assessment & Plan:

## 2015-08-12 NOTE — Progress Notes (Signed)
Pre visit review using our clinic review tool, if applicable. No additional management support is needed unless otherwise documented below in the visit note. 

## 2015-08-12 NOTE — Telephone Encounter (Signed)
Please let pt know that her x ray is suggestive of PNA. I would like her to start zpak (antibiotic) tonight, begin prednisone taper for asthma, continue albuterolas needed and  follow up with Dr. Posey ReaPlotnikov in 1 week. Call if symptoms worsen or do not improve.

## 2015-08-13 ENCOUNTER — Ambulatory Visit: Payer: Managed Care, Other (non HMO) | Admitting: Internal Medicine

## 2015-08-20 ENCOUNTER — Ambulatory Visit (INDEPENDENT_AMBULATORY_CARE_PROVIDER_SITE_OTHER): Payer: Managed Care, Other (non HMO) | Admitting: Internal Medicine

## 2015-08-20 ENCOUNTER — Encounter: Payer: Self-pay | Admitting: Internal Medicine

## 2015-08-20 VITALS — BP 130/80 | HR 74 | Temp 98.9°F | Wt 155.0 lb

## 2015-08-20 DIAGNOSIS — J189 Pneumonia, unspecified organism: Secondary | ICD-10-CM | POA: Diagnosis not present

## 2015-08-20 DIAGNOSIS — J4520 Mild intermittent asthma, uncomplicated: Secondary | ICD-10-CM | POA: Diagnosis not present

## 2015-08-20 MED ORDER — BENZONATATE 200 MG PO CAPS: 200.0000 mg | ORAL_CAPSULE | Freq: Two times a day (BID) | ORAL | Status: DC | PRN

## 2015-08-20 MED ORDER — FLUTICASONE FUROATE-VILANTEROL 100-25 MCG/INH IN AEPB: 1.0000 | INHALATION_SPRAY | Freq: Every day | RESPIRATORY_TRACT | Status: DC

## 2015-08-20 NOTE — Assessment & Plan Note (Signed)
CXR in 6 weeks

## 2015-08-20 NOTE — Progress Notes (Signed)
Subjective:  Patient ID: Megan Medina, female    DOB: 13-Aug-1965  Age: 50 y.o. MRN: [id number]  CC: No chief complaint on file.   HPI Megan Medina presents for cough post-pneumonia x 2 wks  Outpatient Prescriptions Prior to Visit  Medication Sig Dispense Refill  . albuterol (VENTOLIN HFA) 108 (90 BASE) MCG/ACT inhaler Inhale 1-2 puffs into the lungs every 6 (six) hours as needed. 0.05 g 3  . escitalopram (LEXAPRO) 10 MG tablet Take 10 mg by mouth daily. I tab PO daily    . predniSONE (DELTASONE) 10 MG tablet 4 tabs once daily for 2 days, then 3 tabs daily x 2 days, then 2 tabs daily x 2 days, then 1 tab daily x 2 days (Patient not taking: Reported on 08/20/2015) 20 tablet 0  . azithromycin (ZITHROMAX) 250 MG tablet 2 tabs by mouth now, then 1 tab by mouth once daily for 4 more days. (Patient not taking: Reported on 08/20/2015) 6 tablet 0   No facility-administered medications prior to visit.    ROS Review of Systems  Constitutional: Negative for fever, chills, activity change, appetite change, fatigue and unexpected weight change.  HENT: Negative for congestion, mouth sores and sinus pressure.   Eyes: Negative for visual disturbance.  Respiratory: Positive for cough. Negative for chest tightness.   Gastrointestinal: Negative for nausea and abdominal pain.  Genitourinary: Negative for frequency, difficulty urinating and vaginal pain.  Musculoskeletal: Negative for back pain and gait problem.  Skin: Negative for pallor and rash.  Neurological: Negative for dizziness, tremors, weakness, numbness and headaches.  Psychiatric/Behavioral: Negative for confusion and sleep disturbance.    Objective:  BP 130/80 mmHg  Pulse 74  Temp(Src) 98.9 F (37.2 C) (Oral)  Wt 155 lb (70.308 kg)  SpO2 95%  BP Readings from Last 3 Encounters:  08/20/15 130/80  08/12/15 124/78  08/04/15 127/83    Wt Readings from Last 3 Encounters:  08/20/15 155 lb (70.308 kg)  08/12/15  156 lb 6.4 oz (70.943 kg)  02/06/14 143 lb (64.864 kg)    Physical Exam  Constitutional: She appears well-developed. No distress.  HENT:  Head: Normocephalic.  Right Ear: External ear normal.  Left Ear: External ear normal.  Nose: Nose normal.  Mouth/Throat: Oropharynx is clear and moist.  Eyes: Conjunctivae are normal. Pupils are equal, round, and reactive to light. Right eye exhibits no discharge. Left eye exhibits no discharge.  Neck: Normal range of motion. Neck supple. No JVD present. No tracheal deviation present. No thyromegaly present.  Cardiovascular: Normal rate, regular rhythm and normal heart sounds.   Pulmonary/Chest: No stridor. No respiratory distress. She has wheezes.  Abdominal: Soft. Bowel sounds are normal. She exhibits no distension and no mass. There is no tenderness. There is no rebound and no guarding.  Musculoskeletal: She exhibits no edema or tenderness.  Lymphadenopathy:    She has no cervical adenopathy.  Neurological: She displays normal reflexes. No cranial nerve deficit. She exhibits normal muscle tone. Coordination normal.  Skin: No rash noted. No erythema.  Psychiatric: She has a normal mood and affect. Her behavior is normal. Judgment and thought content normal.  wheezes at B bases   I personally provided [facility] inhaler use teaching. After the teaching patient was able to demonstrate it's use effectively. All questions were answered  Lab Results  Component Value Date   WBC 6.5 09/23/2010   HGB 14.2 09/23/2010   HCT 40.5 09/23/2010   PLT 243.0 09/23/2010   GLUCOSE 81  09/23/2010   ALT 21 09/23/2010   AST 19 09/23/2010   NA 140 09/23/2010   K 4.3 09/23/2010   CL 104 09/23/2010   CREATININE 0.7 09/23/2010   BUN 14 09/23/2010   CO2 29 09/23/2010   TSH 1.37 09/23/2010    Dg Chest 2 View  08/12/2015  CLINICAL DATA:  Cough,fever and congestion for 10 days,nonsmoker EXAM: CHEST  2 VIEW COMPARISON:  None. FINDINGS: Midline trachea. Normal heart size  and mediastinal contours. No pleural effusion or pneumothorax. Subtle interstitial prominence is lower lobe predominant and greater on the right. IMPRESSION: Subtle interstitial prominence is lower lobe predominant and worse on the right. In this nonsmoker, findings are suspicious for viral or mycoplasma pneumonia. Chronic asthma could have a similar appearance. Electronically Signed   By: Jeronimo GreavesKyle  Talbot M.D.   On: 08/12/2015 14:45    Assessment & Plan:   There are no diagnoses linked to this encounter. I have discontinued Ms. Gaydos's azithromycin. I am also having her maintain her albuterol, predniSONE, and escitalopram.  Meds ordered this encounter  Medications  . escitalopram (LEXAPRO) 20 MG tablet    Sig: Take 20 mg by mouth daily.     Follow-up: No Follow-up on file.  Sonda PrimesAlex Albirda Shiel, MD

## 2015-08-20 NOTE — Patient Instructions (Addendum)
Use over-the-counter  "cold" medicines  such as "Tylenol cold" , "Advil cold",  "Mucinex" or" Mucinex D"  for cough and congestion.   Avoid decongestants if you have high blood pressure and use "Afrin" nasal spray for nasal congestion as directed instead. Use" Delsym" or" Robitussin" cough syrup varietis for cough.  You can use plain "Tylenol" or "Advil" for fever, chills and achyness. Use Halls or Ricola cough drops.  Please, make an appointment if you are not better or if you're worse.  

## 2015-08-20 NOTE — Progress Notes (Signed)
Pre visit review using our clinic review tool, if applicable. No additional management support is needed unless otherwise documented below in the visit note. 

## 2015-08-20 NOTE — Assessment & Plan Note (Signed)
Exacerbation: start Breo Proair prn

## 2015-10-15 ENCOUNTER — Encounter: Payer: Self-pay | Admitting: Family

## 2015-10-15 ENCOUNTER — Ambulatory Visit (INDEPENDENT_AMBULATORY_CARE_PROVIDER_SITE_OTHER): Payer: Managed Care, Other (non HMO) | Admitting: Family

## 2015-10-15 ENCOUNTER — Ambulatory Visit (HOSPITAL_BASED_OUTPATIENT_CLINIC_OR_DEPARTMENT_OTHER)
Admission: RE | Admit: 2015-10-15 | Discharge: 2015-10-15 | Disposition: A | Discharge status: Home / Self Care | Payer: Managed Care, Other (non HMO) | Source: Ambulatory Visit | Attending: Family | Admitting: Family

## 2015-10-15 VITALS — BP 118/70 | HR 75 | Temp 98.1°F | Ht 63.0 in

## 2015-10-15 DIAGNOSIS — H8110 Benign paroxysmal vertigo, unspecified ear: Secondary | ICD-10-CM

## 2015-10-15 DIAGNOSIS — J189 Pneumonia, unspecified organism: Secondary | ICD-10-CM

## 2015-10-15 MED ORDER — MECLIZINE HCL 25 MG PO TABS: 25.0000 mg | ORAL_TABLET | Freq: Three times a day (TID) | ORAL | 0 refills | Status: DC | PRN

## 2015-10-15 MED ORDER — ONDANSETRON 4 MG PO TBDP: 4.0000 mg | ORAL_TABLET | Freq: Three times a day (TID) | ORAL | 0 refills | Status: DC | PRN

## 2015-10-15 MED ORDER — CLONAZEPAM 0.5 MG PO TABS: 0.5000 mg | ORAL_TABLET | Freq: Two times a day (BID) | ORAL | 0 refills | Status: DC | PRN

## 2015-10-15 NOTE — Progress Notes (Signed)
Subjective:    Patient ID: Megan Medina, female    DOB: [birth year], 50 y.o.   MRN: [id number]  HPI  Megan Medina is a 50 yr old female who presents today with chief complaint of dizziness. She reports that dizziness began yesterday.  Started yesterday AM at 11:15 while sitting at her desk. She reports + nausea.  Vomited yesterday. Only nauseated today with standing. She denies headache, weakness, numbness, ear pain or sinus congestion.  She denies fever.  Denies Dysuria.  She had PNA back in May. Reports that she has a mild lingering cough. She reports that she has been using meclizine 12.5mg  without improvement in her symptoms. Reports pain is worst with "any head movement." Reports previous hx of dizziness but it was much less severe than this episode.    Review of Systems See HPI  Past Medical History:  Diagnosis Date  . Allergic rhinitis   . Asthma   . IBS (irritable bowel syndrome)      Social History   Social History  . Marital status: Married    Spouse name: N/A  . Number of children: N/A  . Years of education: N/A   Occupational History  . Not on file.   Social History Main Topics  . Smoking status: Never Smoker  . Smokeless tobacco: Not on file  . Alcohol use No  . Drug use: No  . Sexual activity: Yes   Other Topics Concern  . Not on file   Social History Narrative   Regular exercise-yes.    Past Surgical History:  Procedure Laterality Date  . TONSILLECTOMY      No family history on file.  Allergies  Allergen Reactions  . Codeine Phosphate     REACTION: unspecified  . Hydrocodone Nausea And Vomiting  . Methylprednisolone     A local reaction to DepoMedrol    Current Outpatient Prescriptions on File Prior to Visit  Medication Sig Dispense Refill  . escitalopram (LEXAPRO) 20 MG tablet Take 20 mg by mouth daily.     No current facility-administered medications on file prior to visit.     BP 118/70   Pulse 75   Temp 98.1 F  (36.7 C) (Oral)   Ht 5\' 3"  (1.6 m)   SpO2 98% Comment: room air      Objective:   Physical Exam  Constitutional: She is oriented to person, place, and time. She appears well-developed and well-nourished.  HENT:  Head: Normocephalic and atraumatic.  Right Ear: Tympanic membrane and ear canal normal.  Left Ear: Tympanic membrane and ear canal normal.  Mouth/Throat: No oropharyngeal exudate, posterior oropharyngeal edema or posterior oropharyngeal erythema.  Cardiovascular: Normal rate, regular rhythm and normal heart sounds.   No murmur heard. Pulmonary/Chest: Effort normal and breath sounds normal. No respiratory distress. She has no wheezes.  Musculoskeletal: She exhibits no edema.  Lymphadenopathy:    She has no cervical adenopathy.  Neurological: She is alert and oriented to person, place, and time. No cranial nerve deficit. She exhibits normal muscle tone. Coordination normal.  Psychiatric: She has a normal mood and affect. Her behavior is normal. Judgment and thought content normal.          Assessment & Plan:  Benign Positional Vertigo- Advised pt to try meclizine 25mg . If this does not help, can try Klonopin (reviewed use in [employer] for vertigo).  I advised her to let us know if symptoms worsen or if symptoms do not improve. Her dad had Meniere's  disease. If her symptoms do not improve may need further evaluation for Menieres and possible referral for Vestibular Rehab.    CAP- never completed CXR to ensure resolution. She would like to do today.  Will order.

## 2015-10-15 NOTE — Patient Instructions (Addendum)
For Vertigo- first try meclizine 25mg  three times daily as needed.  I sent a new rx to your pharmacy. If no improvement with meclizine, you may try klonopin twice daily as needed. This may make you sleepy so avoid driving after taking this medication. For nausea- I sent an rx for zofran to be used as needed.  Please complete your follow up chest x ray on the first floor.

## 2015-10-15 NOTE — Progress Notes (Signed)
Pre visit review using our clinic review tool, if applicable. No additional management support is needed unless otherwise documented below in the visit note. 

## 2015-10-16 ENCOUNTER — Telehealth: Payer: Self-pay | Admitting: Internal Medicine

## 2015-10-16 NOTE — Telephone Encounter (Signed)
I gave her klonopin and she was given printed rx at time of her appointment.  Same class as valium.

## 2015-10-16 NOTE — Telephone Encounter (Signed)
Left detailed message on cell# re: xray result and below Rx response and to call if any questions.

## 2015-10-16 NOTE — Telephone Encounter (Signed)
Relation to QJ:FHLK Call back number:[phone] Pharmacy: [id number] - [place], [place] - [address] [phone] (Phone) [phone] (Fax)      Reason for call:  Patient states NP was going to prescribe valium in addition to meclizine (ANTIVERT) 25 MG tablet

## 2015-11-28 ENCOUNTER — Ambulatory Visit (INDEPENDENT_AMBULATORY_CARE_PROVIDER_SITE_OTHER): Payer: Managed Care, Other (non HMO) | Admitting: Internal Medicine

## 2015-11-28 ENCOUNTER — Encounter: Payer: Self-pay | Admitting: Internal Medicine

## 2015-11-28 DIAGNOSIS — J4520 Mild intermittent asthma, uncomplicated: Secondary | ICD-10-CM

## 2015-11-28 DIAGNOSIS — J0101 Acute recurrent maxillary sinusitis: Secondary | ICD-10-CM

## 2015-11-28 MED ORDER — AMOXICILLIN-POT CLAVULANATE 875-125 MG PO TABS: 1.0000 | ORAL_TABLET | Freq: Two times a day (BID) | ORAL | 0 refills | Status: DC

## 2015-11-28 MED ORDER — BUDESONIDE-FORMOTEROL FUMARATE 160-4.5 MCG/ACT IN AERO: 2.0000 | INHALATION_SPRAY | Freq: Two times a day (BID) | RESPIRATORY_TRACT | 6 refills | Status: DC

## 2015-11-28 NOTE — Progress Notes (Signed)
Subjective:  Patient ID: Megan Medina, female    DOB: [birth year]  Age: 50 y.o. MRN: [id number]  CC: Sinusitis (x 2 days) and Cough (productive at times. )   HPI Megan Medina presents for sinus congestion - yellow to brown x 2 days.  Cough has never stopped since May. Breo helped some - pt stopped using it.  Outpatient Medications Prior to Visit  Medication Sig Dispense Refill  . escitalopram (LEXAPRO) 20 MG tablet Take 20 mg by mouth daily.    . meclizine (ANTIVERT) 25 MG tablet Take 1 tablet (25 mg total) by mouth 3 (three) times daily as needed for dizziness. 30 tablet 0  . clonazePAM (KLONOPIN) 0.5 MG tablet Take 1 tablet (0.5 mg total) by mouth 2 (two) times daily as needed (vertigo). 20 tablet 0  . ondansetron (ZOFRAN ODT) 4 MG disintegrating tablet Take 1 tablet (4 mg total) by mouth every 8 (eight) hours as needed for nausea or vomiting. (Patient not taking: Reported on 11/28/2015) 20 tablet 0   No facility-administered medications prior to visit.     ROS Review of Systems  Constitutional: Negative for activity change, appetite change, chills, fatigue and unexpected weight change.  HENT: Positive for postnasal drip, rhinorrhea, sore throat and voice change. Negative for congestion, mouth sores and sinus pressure.   Eyes: Negative for visual disturbance.  Respiratory: Positive for cough. Negative for chest tightness.   Gastrointestinal: Negative for abdominal pain and nausea.  Genitourinary: Negative for difficulty urinating, frequency and vaginal pain.  Musculoskeletal: Negative for back pain and gait problem.  Skin: Negative for pallor and rash.  Neurological: Negative for dizziness, tremors, weakness, numbness and headaches.  Psychiatric/Behavioral: Negative for confusion and sleep disturbance.    Objective:  BP 118/72   Pulse 72   Temp 99.3 F (37.4 C) (Oral)   Wt 164 lb (74.4 kg)   SpO2 95%   BMI 29.05 kg/m   BP Readings from Last 3  Encounters:  11/28/15 118/72  10/15/15 118/70  08/20/15 130/80    Wt Readings from Last 3 Encounters:  11/28/15 164 lb (74.4 kg)  08/20/15 155 lb (70.3 kg)  08/12/15 156 lb 6.4 oz (70.9 kg)    Physical Exam  Constitutional: She appears well-developed. No distress.  HENT:  Head: Normocephalic.  Right Ear: External ear normal.  Left Ear: External ear normal.  Nose: Nose normal.  Eyes: Conjunctivae are normal. Pupils are equal, round, and reactive to light. Right eye exhibits no discharge. Left eye exhibits no discharge.  Neck: Normal range of motion. Neck supple. No JVD present. No tracheal deviation present. No thyromegaly present.  Cardiovascular: Normal rate, regular rhythm and normal heart sounds.   Pulmonary/Chest: No stridor. No respiratory distress. She has no wheezes.  Abdominal: Soft. Bowel sounds are normal. She exhibits no distension and no mass. There is no tenderness. There is no rebound and no guarding.  Musculoskeletal: She exhibits no edema or tenderness.  Lymphadenopathy:    She has no cervical adenopathy.  Neurological: She displays normal reflexes. No cranial nerve deficit. She exhibits normal muscle tone. Coordination normal.  Skin: No rash noted. No erythema.  Psychiatric: She has a normal mood and affect. Her behavior is normal. Judgment and thought content normal.  eryth throat  Lab Results  Component Value Date   WBC 6.5 09/23/2010   HGB 14.2 09/23/2010   HCT 40.5 09/23/2010   PLT 243.0 09/23/2010   GLUCOSE 81 09/23/2010   ALT 21 09/23/2010  AST 19 09/23/2010   NA 140 09/23/2010   K 4.3 09/23/2010   CL 104 09/23/2010   CREATININE 0.7 09/23/2010   BUN 14 09/23/2010   CO2 29 09/23/2010   TSH 1.37 09/23/2010    Dg Chest 2 View  Result Date: 10/15/2015 CLINICAL DATA:  History of recent pneumonia, followup, vertigo EXAM: CHEST  2 VIEW COMPARISON:  Chest x-ray of 08/12/2015 FINDINGS: No focal infiltrate or effusion is seen. The areas noted on prior  chest x-ray are not seen currently. Mediastinal and hilar contours are unremarkable. The heart is within normal limits in size. No bony abnormality is seen. IMPRESSION: No active cardiopulmonary disease. Electronically Signed   By: Dwyane Dee M.D.   On: 10/15/2015 15:42   Assessment & Plan:   There are no diagnoses linked to this encounter. I am having [name] maintain her escitalopram, clonazePAM, meclizine, ondansetron, and valACYclovir.  Meds ordered this encounter  Medications  . valACYclovir (VALTREX) 500 MG tablet    Sig: Take 1 tablet by mouth daily.     Follow-up: No Follow-up on file.  Sonda Primes, MD

## 2015-11-28 NOTE — Progress Notes (Signed)
Pre visit review using our clinic review tool, if applicable. No additional management support is needed unless otherwise documented below in the visit note. 

## 2015-11-28 NOTE — Assessment & Plan Note (Addendum)
Augmentin po Sinus CT or ENT ref if relapsed

## 2015-11-28 NOTE — Assessment & Plan Note (Addendum)
Not better D/c Breo Symbicort to try

## 2016-01-29 ENCOUNTER — Telehealth: Payer: Self-pay | Admitting: Internal Medicine

## 2016-01-29 NOTE — Telephone Encounter (Signed)
Per dr Posey Reaplotnikov, ok to give prevnar 13 , added to appt notes tomorrow

## 2016-01-29 NOTE — Telephone Encounter (Signed)
Pt called req pneumonia injection. Please check, appt is schedule for tomorrow.

## 2016-01-30 ENCOUNTER — Ambulatory Visit: Payer: Managed Care, Other (non HMO)

## 2016-02-19 ENCOUNTER — Ambulatory Visit: Payer: Managed Care, Other (non HMO)

## 2016-03-24 ENCOUNTER — Ambulatory Visit (INDEPENDENT_AMBULATORY_CARE_PROVIDER_SITE_OTHER): Payer: Managed Care, Other (non HMO) | Admitting: Internal Medicine

## 2016-03-24 ENCOUNTER — Encounter: Payer: Self-pay | Admitting: Internal Medicine

## 2016-03-24 VITALS — BP 136/78 | HR 86 | Temp 98.4°F | Resp 20 | Wt 165.0 lb

## 2016-03-24 DIAGNOSIS — J0101 Acute recurrent maxillary sinusitis: Secondary | ICD-10-CM | POA: Diagnosis not present

## 2016-03-24 MED ORDER — AMOXICILLIN-POT CLAVULANATE 875-125 MG PO TABS: 1.0000 | ORAL_TABLET | Freq: Two times a day (BID) | ORAL | 0 refills | Status: DC

## 2016-03-24 NOTE — Progress Notes (Signed)
Pre visit review using our clinic review tool, if applicable. No additional management support is needed unless otherwise documented below in the visit note. 

## 2016-03-24 NOTE — Progress Notes (Signed)
   Subjective:    Patient ID: Megan Medina, female    DOB: Mar 31, 1965, 51 y.o.   MRN: [id number]  HPI   Here with 2-3 days acute onset fever, facial pain, pressure, headache, general weakness and malaise, and greenish d/c, with mild ST and cough, but pt denies chest pain, wheezing, increased sob or doe, orthopnea, PND, increased LE swelling, palpitations, dizziness or syncope. No other new history Past Medical History:  Diagnosis Date  . Allergic rhinitis   . Asthma   . IBS (irritable bowel syndrome)    Past Surgical History:  Procedure Laterality Date  . TONSILLECTOMY      reports that she has never smoked. She does not have any smokeless tobacco history on file. She reports that she does not drink alcohol or use drugs. family history is not on file. Allergies  Allergen Reactions  . Codeine Phosphate     REACTION: unspecified  . Hydrocodone Nausea And Vomiting  . Methylprednisolone     A local reaction to DepoMedrol   Current Outpatient Prescriptions on File Prior to Visit  Medication Sig Dispense Refill  . budesonide-formoterol (SYMBICORT) 160-4.5 MCG/ACT inhaler Inhale 2 puffs into the lungs 2 (two) times daily. 1 Inhaler 6  . escitalopram (LEXAPRO) 20 MG tablet Take 20 mg by mouth daily.    . meclizine (ANTIVERT) 25 MG tablet Take 1 tablet (25 mg total) by mouth 3 (three) times daily as needed for dizziness. 30 tablet 0  . ondansetron (ZOFRAN ODT) 4 MG disintegrating tablet Take 1 tablet (4 mg total) by mouth every 8 (eight) hours as needed for nausea or vomiting. 20 tablet 0  . valACYclovir (VALTREX) 500 MG tablet Take 1 tablet by mouth daily.    . clonazePAM (KLONOPIN) 0.5 MG tablet Take 1 tablet (0.5 mg total) by mouth 2 (two) times daily as needed (vertigo). 20 tablet 0   No current facility-administered medications on file prior to visit.    Review of Systems All otherwise neg per pt    Objective:   Physical Exam BP 136/78   Pulse 86   Temp 98.4 F (36.9  C) (Oral)   Resp 20   Wt 165 lb (74.8 kg)   SpO2 98%   BMI 29.23 kg/m  VS noted,  Constitutional: Pt appears in no apparent distress HENT: Head: NCAT.  Right Ear: External ear normal.  Left Ear: External ear normal.  Bilat tm's with mild erythema.  Max sinus areas mild tender.  Pharynx with mild erythema, no exudate Eyes: . Pupils are equal, round, and reactive to light. Conjunctivae and EOM are normal Neck: Normal range of motion. Neck supple.  Cardiovascular: Normal rate and regular rhythm.   Pulmonary/Chest: Effort normal and breath sounds without rales or wheezing.  Neurological: Pt is alert. Not confused , motor grossly intact Skin: Skin is warm. No rash, no LE edema Psychiatric: Pt behavior is normal. No agitation.  No other new exam findings    Assessment & Plan:

## 2016-03-24 NOTE — Patient Instructions (Signed)
Please take all new medication as prescribed - the antibiotic  You can also take Delsym OTC for cough, and/or Mucinex (or it's generic off brand) for congestion, and tylenol as needed for pain.  Please continue all other medications as before, and refills have been done if requested.  Please have the pharmacy call with any other refills you may need.  Please keep your appointments with your specialists as you may have planned    

## 2016-03-27 ENCOUNTER — Telehealth: Payer: Self-pay | Admitting: Internal Medicine

## 2016-03-27 NOTE — Telephone Encounter (Signed)
Pt called in said that she is not any better and she feels worse .  She wants to know what she needs to do

## 2016-03-28 NOTE — Telephone Encounter (Signed)
pls finish Augmentin given by Dr Jonny RuizJohn OV if issues Thx

## 2016-03-29 NOTE — Assessment & Plan Note (Signed)
Mild to mod, for antibx course,  to f/u any worsening symptoms or concerns 

## 2016-03-31 ENCOUNTER — Ambulatory Visit (INDEPENDENT_AMBULATORY_CARE_PROVIDER_SITE_OTHER): Payer: Managed Care, Other (non HMO) | Admitting: Family Medicine

## 2016-03-31 ENCOUNTER — Encounter: Payer: Self-pay | Admitting: Family Medicine

## 2016-03-31 VITALS — BP 102/70 | HR 86 | Resp 12 | Ht 63.0 in | Wt 161.5 lb

## 2016-03-31 DIAGNOSIS — J45901 Unspecified asthma with (acute) exacerbation: Secondary | ICD-10-CM | POA: Diagnosis not present

## 2016-03-31 DIAGNOSIS — J069 Acute upper respiratory infection, unspecified: Secondary | ICD-10-CM | POA: Diagnosis not present

## 2016-03-31 LAB — CBC WITH DIFFERENTIAL/PLATELET
BASOS PCT: 0.4 % (ref 0.0–3.0)
Basophils Absolute: 0 10*3/uL (ref 0.0–0.1)
EOS PCT: 2.1 % (ref 0.0–5.0)
Eosinophils Absolute: 0.2 10*3/uL (ref 0.0–0.7)
HEMATOCRIT: 39.8 % (ref 36.0–46.0)
HEMOGLOBIN: 13.8 g/dL (ref 12.0–15.0)
Lymphocytes Relative: 44.2 % (ref 12.0–46.0)
Lymphs Abs: 3.2 10*3/uL (ref 0.7–4.0)
MCHC: 34.8 g/dL (ref 30.0–36.0)
MCV: 88 fl (ref 78.0–100.0)
MONO ABS: 0.5 10*3/uL (ref 0.1–1.0)
Monocytes Relative: 7.5 % (ref 3.0–12.0)
NEUTROS ABS: 3.3 10*3/uL (ref 1.4–7.7)
Neutrophils Relative %: 45.8 % (ref 43.0–77.0)
PLATELETS: 299 10*3/uL (ref 150.0–400.0)
RBC: 4.53 Mil/uL (ref 3.87–5.11)
RDW: 13 % (ref 11.5–15.5)
WBC: 7.2 10*3/uL (ref 4.0–10.5)

## 2016-03-31 MED ORDER — PREDNISONE 20 MG PO TABS: 40.0000 mg | ORAL_TABLET | Freq: Every day | ORAL | 0 refills | Status: AC

## 2016-03-31 MED ORDER — ALBUTEROL SULFATE HFA 108 (90 BASE) MCG/ACT IN AERS: 2.0000 | INHALATION_SPRAY | Freq: Four times a day (QID) | RESPIRATORY_TRACT | 0 refills | Status: DC | PRN

## 2016-03-31 MED ORDER — IPRATROPIUM-ALBUTEROL 0.5-2.5 (3) MG/3ML IN SOLN
3.0000 mL | Freq: Once | RESPIRATORY_TRACT | Status: AC
  Administered 2016-03-31: 3 mL via RESPIRATORY_TRACT

## 2016-03-31 MED ORDER — IPRATROPIUM-ALBUTEROL 0.5-2.5 (3) MG/3ML IN SOLN: 3.0000 mL | Freq: Four times a day (QID) | RESPIRATORY_TRACT | Status: DC

## 2016-03-31 NOTE — Progress Notes (Signed)
HPI:  ACUTE VISIT  Chief Complaint  Patient presents with  . Cough  . URI    Megan Medina is a 51 y.o.female here today complaining of 8-10 days of respiratory symptoms.  She was seen 03/24/16 and started on Augmentin for acute sinusitis.  +Productive cough with "little" sputum, no hemoptysis.  + Nasal congestion, rhinorrhea, sore throat, and post nasal drainage. Reporting fever of max 99.8 F on 03/26/16 and last noted on 03/27/16.  She has not noted chest pain. Mild exertional dyspnea + wheezing.  No Hx of recent travel. + Sick contact: Family members and co-workers.  No known insect bite.  Hx of allergies: Yes, Hx of asthma.She is currently on Symbicort bid, she is not on Albuterol.    She feels like cough and wheezing are getting worse.     Review of Systems  Constitutional: Negative for activity change, appetite change, fatigue and fever.  HENT: Positive for congestion, postnasal drip, rhinorrhea and sore throat. Negative for ear pain, facial swelling, mouth sores, sinus pain, trouble swallowing and voice change.   Eyes: Negative for discharge and redness.  Respiratory: Positive for cough, shortness of breath and wheezing. Negative for stridor.   Cardiovascular: Negative for chest pain.  Gastrointestinal: Negative for abdominal pain, diarrhea, nausea and vomiting.  Musculoskeletal: Negative for myalgias and neck pain.  Skin: Negative for rash.  Allergic/Immunologic: Positive for environmental allergies.  Neurological: Negative for syncope, weakness and headaches.  Hematological: Negative for adenopathy. Does not bruise/bleed easily.  Psychiatric/Behavioral: Negative for confusion. The patient is nervous/anxious.       Current Outpatient Prescriptions on File Prior to Visit  Medication Sig Dispense Refill  . amoxicillin-clavulanate (AUGMENTIN) 875-125 MG tablet Take 1 tablet by mouth 2 (two) times daily. 28 tablet 0  . budesonide-formoterol  (SYMBICORT) 160-4.5 MCG/ACT inhaler Inhale 2 puffs into the lungs 2 (two) times daily. 1 Inhaler 6  . escitalopram (LEXAPRO) 20 MG tablet Take 20 mg by mouth daily.    . meclizine (ANTIVERT) 25 MG tablet Take 1 tablet (25 mg total) by mouth 3 (three) times daily as needed for dizziness. 30 tablet 0  . ondansetron (ZOFRAN ODT) 4 MG disintegrating tablet Take 1 tablet (4 mg total) by mouth every 8 (eight) hours as needed for nausea or vomiting. 20 tablet 0  . valACYclovir (VALTREX) 500 MG tablet Take 1 tablet by mouth daily.    . clonazePAM (KLONOPIN) 0.5 MG tablet Take 1 tablet (0.5 mg total) by mouth 2 (two) times daily as needed (vertigo). 20 tablet 0   No current facility-administered medications on file prior to visit.      Past Medical History:  Diagnosis Date  . Allergic rhinitis   . Asthma   . IBS (irritable bowel syndrome)    Allergies  Allergen Reactions  . Codeine Phosphate     REACTION: unspecified  . Hydrocodone Nausea And Vomiting  . Methylprednisolone     A local reaction to DepoMedrol    Social History   Social History  . Marital status: Married    Spouse name: N/A  . Number of children: N/A  . Years of education: N/A   Social History Main Topics  . Smoking status: Never Smoker  . Smokeless tobacco: None  . Alcohol use No  . Drug use: No  . Sexual activity: Yes   Other Topics Concern  . None   Social History Narrative   Regular exercise-yes.    Vitals:  03/31/16 1348  BP: 102/70  Pulse: 86  Resp: 12   O2 sat 95% at RA.  Body mass index is 28.61 kg/m.    Physical Exam  Nursing note and vitals reviewed. Constitutional: She is oriented to person, place, and time. She appears well-developed. She does not appear ill. No distress.  HENT:  Head: Atraumatic.  Right Ear: Tympanic membrane, external ear and ear canal normal.  Left Ear: Tympanic membrane, external ear and ear canal normal.  Nose: Right sinus exhibits no maxillary sinus  tenderness and no frontal sinus tenderness. Left sinus exhibits no maxillary sinus tenderness and no frontal sinus tenderness.  Mouth/Throat: Uvula is midline, oropharynx is clear and moist and mucous membranes are normal.  Post nasal drainage.   Eyes: Conjunctivae are normal.  Cardiovascular: Normal rate and regular rhythm.   No murmur heard. Respiratory: Effort normal. No stridor. No respiratory distress. She has wheezes (Mild, bilateral). She has no rales.  Musculoskeletal: She exhibits no edema.  Lymphadenopathy:       Head (right side): No submandibular adenopathy present.       Head (left side): No submandibular adenopathy present.    She has no cervical adenopathy.  Neurological: She is alert and oriented to person, place, and time. She has normal strength. Coordination and gait normal.  Skin: Skin is warm. No rash noted. No erythema.  Psychiatric: Her speech is normal. Her mood appears anxious.  Well groomed, good eye contact.      ASSESSMENT AND PLAN:     [name] was seen today for cough and uri.  Diagnoses and all orders for this visit:    Lab Results  Component Value Date   WBC 7.2 03/31/2016   HGB 13.8 03/31/2016   HCT 39.8 03/31/2016   MCV 88.0 03/31/2016   PLT 299.0 03/31/2016     Mild asthma with acute exacerbation, unspecified whether persistent  She is reporting feeling better after Duoneb treatment, wheezing improved.No rales or rhonchi. She is very concerned about having pneumonia, CXR order placed. After discussion of side effects she agrees with taking Prednisone, she has taken it before and no problems reported. Albuterol inh qid for a week then as needed. No changes in Symbicort 160-4.5 mcg. Instructed about warning signs.  F/U with PCP in 2 weeks, before if needed.    -     CBC with Differential -     DG Chest 2 View; Future -     ipratropium-albuterol (DUONEB) 0.5-2.5 (3) MG/3ML nebulizer solution 3 mL; Take 3 mLs by nebulization  once. -     predniSONE (DELTASONE) 20 MG tablet; Take 2 tablets (40 mg total) by mouth daily with breakfast. -     albuterol (PROVENTIL HFA;VENTOLIN HFA) 108 (90 Base) MCG/ACT inhaler; Inhale 2 puffs into the lungs every 6 (six) hours as needed for wheezing or shortness of breath.  URI, acute  Sinusitis symptoms have improved. No changes in abx treatment. Explained that nasal congestion and cough can last a few weeks.           -Ms. Elberta Leatherwoodlizabeth P Lokken was advised to return or notify a doctor immediately if symptoms worsen or new concerns arise, she voices understanding.       Torben Soloway G. [place]az[place], MD  [facility]. [place] office.

## 2016-03-31 NOTE — Progress Notes (Signed)
Pre visit review using our clinic review tool, if applicable. No additional management support is needed unless otherwise documented below in the visit note. 

## 2016-03-31 NOTE — Patient Instructions (Signed)
  Ms.Kierah P Clinton SawyerWilliamson I have seen you today for an acute visit.  1. Mild asthma with acute exacerbation, unspecified whether persistent  Albuterol 2 puffs every 6 hours for a week and then as needed. Continue Symbicort. Prednisone today with breakfast.  - CBC with Differential - DG Chest 2 View; Future - predniSONE (DELTASONE) 20 MG tablet; Take 2 tablets (40 mg total) by mouth daily with breakfast.  Dispense: 10 tablet; Refill: 0 - albuterol (PROVENTIL HFA;VENTOLIN HFA) 108 (90 Base) MCG/ACT inhaler; Inhale 2 puffs into the lungs every 6 (six) hours as needed for wheezing or shortness of breath.  Dispense: 1 Inhaler; Refill: 0  2. URI, acute  - ipratropium-albuterol (DUONEB) 0.5-2.5 (3) MG/3ML nebulizer solution 3 mL; Take 3 mLs by nebulization once.  Cough and nasal congestion can last a few weeks. Monitor for fever, 100 F or more.    In general please monitor for signs of worsening symptoms and seek immediate medical attention if any concerning/warning symptom.  If symptoms are not resolved in 2 weeks you should schedule a follow up appointment with your doctor, before if needed.  Please be sure you have an appointment already scheduled with your PCP before you leave today.

## 2016-04-01 ENCOUNTER — Ambulatory Visit (INDEPENDENT_AMBULATORY_CARE_PROVIDER_SITE_OTHER)
Admission: RE | Admit: 2016-04-01 | Discharge: 2016-04-01 | Disposition: A | Discharge status: Home / Self Care | Payer: Managed Care, Other (non HMO) | Source: Ambulatory Visit | Attending: Family Medicine | Admitting: Family Medicine

## 2016-04-01 DIAGNOSIS — J45901 Unspecified asthma with (acute) exacerbation: Secondary | ICD-10-CM

## 2016-05-23 ENCOUNTER — Ambulatory Visit (INDEPENDENT_AMBULATORY_CARE_PROVIDER_SITE_OTHER): Payer: Managed Care, Other (non HMO) | Admitting: Family Medicine

## 2016-05-23 DIAGNOSIS — B9789 Other viral agents as the cause of diseases classified elsewhere: Secondary | ICD-10-CM | POA: Diagnosis not present

## 2016-05-23 DIAGNOSIS — J069 Acute upper respiratory infection, unspecified: Secondary | ICD-10-CM | POA: Diagnosis not present

## 2016-05-23 MED ORDER — PREDNISONE 20 MG PO TABS: 40.0000 mg | ORAL_TABLET | Freq: Every day | ORAL | 0 refills | Status: DC

## 2016-05-23 NOTE — Assessment & Plan Note (Signed)
Suspect viral upper respiratory infection with asthma exacerbation given lack of fevers and given duration. Doubt bacterial illness. No focal findings on exam to indicate bacterial illness. Vital signs are stable. Given wheezing and coarse breath sounds we'll cover with prednisone given her history of asthma. She will use her albuterol inhaler as well. She'll monitor and if not improving follow-up. Given return precautions.

## 2016-05-23 NOTE — Progress Notes (Signed)
  Marikay AlarEric Makani Seckman, MD Phone: [phone]  Megan Medina is a 51 y.o. female who presents today for same visit.  Patient notes 5 days of sinus congestion and chest congestion. She is blowing yellow mucus out of her nose. Coughing up a little bit of yellow mucus. She notes her ears feel stopped up left greater than right. She notes no fevers. No wheezing. Minimal shortness of breath at times. She does note sick contacts. She has been using her inhalers with some benefit. She has a history of asthma.  PMH: Nonsmoker  ROS see history of present illness  Objective  Physical Exam Vitals:   05/23/16 0909  BP: 110/70  Pulse: 76  Resp: 16  Temp: 97.9 F (36.6 C)    BP Readings from Last 3 Encounters:  05/23/16 110/70  03/31/16 102/70  03/24/16 136/78   Wt Readings from Last 3 Encounters:  05/23/16 167 lb (75.8 kg)  03/31/16 161 lb 8 oz (73.3 kg)  03/24/16 165 lb (74.8 kg)    Physical Exam  Constitutional: No distress.  HENT:  Head: Normocephalic and atraumatic.  Mouth/Throat: Oropharynx is clear and moist. No oropharyngeal exudate.  Normal TMs bilaterally, sinuses nontender to percussion  Eyes: Conjunctivae are normal. Pupils are equal, round, and reactive to light.  Neck: Neck supple.  Cardiovascular: Normal rate, regular rhythm and normal heart sounds.   Pulmonary/Chest: Effort normal. No respiratory distress.  Mild expiratory wheezes with prolonged expiratory phase, mild coarse breath sounds throughout  Musculoskeletal: She exhibits no edema.  Lymphadenopathy:    She has no cervical adenopathy.  Neurological: She is alert. Gait normal.  Skin: Skin is warm and dry. She is not diaphoretic.     Assessment/Plan: Please see individual problem list.  Viral upper respiratory infection Suspect viral upper respiratory infection with asthma exacerbation given lack of fevers and given duration. Doubt bacterial illness. No focal findings on exam to indicate bacterial  illness. Vital signs are stable. Given wheezing and coarse breath sounds we'll cover with prednisone given her history of asthma. She will use her albuterol inhaler as well. She'll monitor and if not improving follow-up. Given return precautions.   No orders of the defined types were placed in this encounter.   Meds ordered this encounter  Medications  . predniSONE (DELTASONE) 20 MG tablet    Sig: Take 2 tablets (40 mg total) by mouth daily with breakfast.    Dispense:  10 tablet    Refill:  0    Marikay AlarEric Alaiyah Bollman, MD [facility] [place]

## 2016-05-23 NOTE — Patient Instructions (Signed)
Nice to meet you. You likely have a viral illness leading to asthma exacerbation. We will treat you with prednisone and have you use your albuterol inhaler every 6 hours for the next 2 days and then as needed. If you develop shortness of breath, cough productive of blood, fevers, or any new symptoms please seek medical attention.

## 2016-05-23 NOTE — Progress Notes (Signed)
Pre visit review using our clinic review tool, if applicable. No additional management support is needed unless otherwise documented below in the visit note. 

## 2016-05-28 ENCOUNTER — Telehealth: Payer: Self-pay | Admitting: Family Medicine

## 2016-05-28 NOTE — Telephone Encounter (Signed)
I would suggest reevaluation if she is not feeling any better to determine what the next best step in management would be. Reevaluating her would allow us to figure out if she needs an antibiotic or some other course of treatment.

## 2016-05-28 NOTE — Telephone Encounter (Signed)
Patient called in saying she was here and saw Dr. Birdie SonsSonnenberg on Saturday. She is not feeling any better and some ways feeling worse. She would like to know if something else could be sent it. Please advise.

## 2016-05-29 ENCOUNTER — Ambulatory Visit: Payer: Managed Care, Other (non HMO) | Admitting: Family

## 2016-05-29 NOTE — Telephone Encounter (Signed)
I called patient no answer, I did put her on the schdule today. Informed her to call the office back if it wont work.

## 2016-05-30 ENCOUNTER — Ambulatory Visit (INDEPENDENT_AMBULATORY_CARE_PROVIDER_SITE_OTHER): Payer: Managed Care, Other (non HMO) | Admitting: Family Medicine

## 2016-05-30 DIAGNOSIS — J988 Other specified respiratory disorders: Secondary | ICD-10-CM | POA: Diagnosis not present

## 2016-05-30 MED ORDER — LEVOFLOXACIN 500 MG PO TABS: 500.0000 mg | ORAL_TABLET | Freq: Every day | ORAL | 0 refills | Status: DC

## 2016-05-30 NOTE — Patient Instructions (Signed)
Voice rest.  Supportive care.  Antibiotic as prescribed.  Take care  Dr. Adriana Simasook

## 2016-05-30 NOTE — Progress Notes (Signed)
   Subjective:  Patient ID: Megan Medina, female    DOB: 1965-09-03  Age: 51 y.o. MRN: [id number]  CC:  URI  HPI:  51 year old female with past medical history of asthma presents with URI symptoms.  Patient states that she's been sick for approximately 2 weeks. She was seen last week at Saturday clinic. She was treated with prednisone and has failed to improve. She reports cough, congestion, hoarseness, ear pain, sinus drainage. Cough is mildly productive. She reports discolored nasal discharge/drainage. No known exacerbating or relieving factors. No other associated symptoms. No other complaints or concerns this time.  Social Hx   Social History   Social History  . Marital status: Married    Spouse name: N/A  . Number of children: N/A  . Years of education: N/A   Social History Main Topics  . Smoking status: Never Smoker  . Smokeless tobacco: Not on file  . Alcohol use No  . Drug use: No  . Sexual activity: Yes   Other Topics Concern  . Not on file   Social History Narrative   Regular exercise-yes.    Review of Systems  Constitutional: Negative.   HENT: Positive for congestion, ear pain, postnasal drip and voice change.   Respiratory: Positive for cough.    Objective:  BP 122/76   Pulse 76   Temp 98.3 F (36.8 C) (Oral)   Wt 168 lb (76.2 kg)   SpO2 98%   BMI 29.76 kg/m   BP/Weight 05/30/2016 05/23/2016 03/31/2016  Systolic BP 122 110 102  Diastolic BP 76 70 70  Wt. (Lbs) 168 167 161.5  BMI 29.76 29.58 28.61    Physical Exam  Constitutional: She is oriented to person, place, and time. She appears well-developed. No distress.  HENT:  Head: Normocephalic and atraumatic.  Normal TMs bilaterally. Oropharyngeal erythema. No exudate.  Neck: Neck supple.  Cardiovascular: Normal rate and regular rhythm.   Pulmonary/Chest: Effort normal and breath sounds normal.  Lymphadenopathy:    She has no cervical adenopathy.  Neurological: She is alert and oriented  to person, place, and time.  Psychiatric: She has a normal mood and affect.  Vitals reviewed.  Lab Results  Component Value Date   WBC 7.2 03/31/2016   HGB 13.8 03/31/2016   HCT 39.8 03/31/2016   PLT 299.0 03/31/2016   GLUCOSE 81 09/23/2010   ALT 21 09/23/2010   AST 19 09/23/2010   NA 140 09/23/2010   K 4.3 09/23/2010   CL 104 09/23/2010   CREATININE 0.7 09/23/2010   BUN 14 09/23/2010   CO2 29 09/23/2010   TSH 1.37 09/23/2010    Assessment & Plan:   Problem List Items Addressed This Visit    Respiratory infection    Established problem, worsening. Patient with persistent symptoms. Has a history of sinusitis and asthma. Given duration of illness, will treat empirically with antibiotic. Advised voice rest and supportive care as well.        Meds ordered this encounter  Medications  . levofloxacin (LEVAQUIN) 500 MG tablet    Sig: Take 1 tablet (500 mg total) by mouth daily.    Dispense:  7 tablet    Refill:  0    Follow-up: PRN  Everlene OtherJayce Jacub Waiters DO [facility] [place]

## 2016-05-30 NOTE — Assessment & Plan Note (Addendum)
Established problem, worsening. Patient with persistent symptoms. Has a history of sinusitis and asthma. Given duration of illness, will treat empirically with antibiotic. Advised voice rest and supportive care as well.

## 2016-05-30 NOTE — Progress Notes (Signed)
Pre visit review using our clinic review tool, if applicable. No additional management support is needed unless otherwise documented below in the visit note. 

## 2016-06-16 ENCOUNTER — Ambulatory Visit (INDEPENDENT_AMBULATORY_CARE_PROVIDER_SITE_OTHER)
Admission: RE | Admit: 2016-06-16 | Discharge: 2016-06-16 | Disposition: A | Discharge status: Home / Self Care | Payer: Managed Care, Other (non HMO) | Source: Ambulatory Visit | Attending: Internal Medicine | Admitting: Internal Medicine

## 2016-06-16 ENCOUNTER — Ambulatory Visit (INDEPENDENT_AMBULATORY_CARE_PROVIDER_SITE_OTHER): Payer: Managed Care, Other (non HMO) | Admitting: Internal Medicine

## 2016-06-16 ENCOUNTER — Encounter: Payer: Self-pay | Admitting: Internal Medicine

## 2016-06-16 VITALS — BP 130/78 | HR 95 | Temp 98.8°F | Resp 16 | Ht 63.0 in | Wt 171.0 lb

## 2016-06-16 DIAGNOSIS — R05 Cough: Secondary | ICD-10-CM | POA: Diagnosis not present

## 2016-06-16 DIAGNOSIS — R059 Cough, unspecified: Secondary | ICD-10-CM

## 2016-06-16 LAB — POCT EXHALED NITRIC OXIDE: FeNO level (ppb): <5

## 2016-06-16 MED ORDER — FLUTICASONE PROPIONATE 50 MCG/ACT NA SUSP: 2.0000 | Freq: Every day | NASAL | 6 refills | Status: DC

## 2016-06-16 MED ORDER — MONTELUKAST SODIUM 10 MG PO TABS: 10.0000 mg | ORAL_TABLET | Freq: Every day | ORAL | 3 refills | Status: DC

## 2016-06-16 MED ORDER — PROMETHAZINE-DM 6.25-15 MG/5ML PO SYRP: 5.0000 mL | ORAL_SOLUTION | Freq: Four times a day (QID) | ORAL | 0 refills | Status: DC | PRN

## 2016-06-16 NOTE — Patient Instructions (Signed)
Your test results indicate that prednisone will not be helpful at all for your lungs.   You do not have clinical signs of sinus infection today requiring antibiotics.   We will check a chest x-ray to be sure there is no infection but will not prescribe antibiotics at this time.   We have sent in singulair and flonase for the sinuses and recommend that you start using both immediately. The singulair is a medicine that you take once daily for sinuses and allergies. The flonase is a nose spray that you spray 2 sprays in each nostril once a day. It can take 3-4 days for this to start working and you should continue taking for the next 1-2 months for allergy season.   We have also sent in cough medicine which does not have codeine in it to try which is stronger than the over the counter medicine. Do not use the albuterol inhaler for coughing as this is for the breathing and can have problems working if used too often for breathing.

## 2016-06-16 NOTE — Progress Notes (Signed)
Pre visit review using our clinic review tool, if applicable. No additional management support is needed unless otherwise documented below in the visit note. 

## 2016-06-16 NOTE — Assessment & Plan Note (Addendum)
FeNO <5 which is indicative that prednisone is not indicated and her asthma is not flared. Checking CXR due to ongoing cough 2-3 months and asthma with pneumonia in the last year. She will start taking singulair and flonase which are both prescribed today. Also rx for promethazine/DM cough syrup which is recommended in place of albuterol for her cough. She does not have indication for prednisone or antibiotics today and likely several of her recent courses were not indicated. Extensive counseling about the nature of her symptoms and the need for sinus medication to improve her symptoms and the lack of indication for repeated antibiotics which have not helped her.

## 2016-06-16 NOTE — Progress Notes (Signed)
   Subjective:    Patient ID: Megan Medina, female    DOB: [birth year], 51 y.o.   MRN: [id number]  HPI The patient is a 51 YO female coming in for cough and congestion. She does have history of asthma. Was treated at the beginning of march with prednisone and did not improve. She then was given a round of levaquin (for possible sinus infection) and still did not improve. She is still coughing. Takes only symbicort and albuterol most of the time for her asthma. She also has had amoxicillin from another place as well for the same thing. She denies taking anything for sinuses in the last month. She has taken things in the past and does not recall how they worked. She has also not taken anything for the cough except robitussin. She is not able to take codeine cough medicines. She is using albuterol about every 4 hours for coughing.   Review of Systems  Constitutional: Positive for activity change, fatigue and fever. Negative for appetite change, chills, diaphoresis and unexpected weight change.  HENT: Positive for congestion, ear pain, postnasal drip, rhinorrhea and sinus pressure. Negative for dental problem, drooling, ear discharge, facial swelling, hearing loss, mouth sores, nosebleeds, sinus pain, sneezing, sore throat and trouble swallowing.   Eyes: Negative.   Respiratory: Positive for cough, shortness of breath and wheezing. Negative for apnea and chest tightness.   Cardiovascular: Negative.   Gastrointestinal: Negative.   Musculoskeletal: Negative.   Neurological: Negative.      Objective:   Physical Exam  Constitutional: She is oriented to person, place, and time. She appears well-developed and well-nourished. No distress.  HENT:  Head: Normocephalic and atraumatic.  Right Ear: External ear normal.  Left Ear: External ear normal.  Oropharynx with redness and clear drainage, nose without crusting, no sinus pressure on exam  Eyes: EOM are normal.  Neck: Normal range of motion.  No JVD present.  Cardiovascular: Normal rate and regular rhythm.   Pulmonary/Chest: Effort normal and breath sounds normal.  Some mild rhonchi which clears with coughing, no wheezing on exam. No respiratory distress  Abdominal: Soft.  Lymphadenopathy:    She has no cervical adenopathy.  Neurological: She is alert and oriented to person, place, and time.  Skin: Skin is warm and dry.   Vitals:   06/16/16 1552  BP: 130/78  Pulse: 95  Resp: 16  Temp: 98.8 F (37.1 C)  TempSrc: Oral  SpO2: 99%  Weight: 171 lb (77.6 kg)  Height: 5\' 3"  (1.6 m)   FeNO: <5    Assessment & Plan:

## 2016-06-17 ENCOUNTER — Telehealth: Payer: Self-pay | Admitting: Internal Medicine

## 2016-06-17 NOTE — Telephone Encounter (Signed)
Pt states she was seen and the diagnosis was allergies. Pt called stating she is running a fever of 99 and alternating tylenol and advil, She would like to know if something can be called in for her. Please advise.

## 2016-06-18 ENCOUNTER — Telehealth: Payer: Self-pay | Admitting: Internal Medicine

## 2016-06-18 ENCOUNTER — Other Ambulatory Visit: Payer: Self-pay | Admitting: Internal Medicine

## 2016-06-18 MED ORDER — AZITHROMYCIN 250 MG PO TABS: ORAL_TABLET | ORAL | 1 refills | Status: DC

## 2016-06-18 NOTE — Telephone Encounter (Signed)
I called patient back and she does not believe it is just allergies, states at one point her temperature got to be 101 and that was with  medication in her system.

## 2016-06-18 NOTE — Telephone Encounter (Signed)
She was given appropriate treatment. A temperature of 99 is not a fever.

## 2016-06-18 NOTE — Telephone Encounter (Signed)
Pt husband in for OV, states wife has temp up to 101 now, feeling worse ST, malaise  Ok for tx - done erx

## 2016-06-18 NOTE — Telephone Encounter (Signed)
She can schedule a visit with her PCP if she wants. No more treatment is recommended based on her visit.

## 2016-12-23 ENCOUNTER — Other Ambulatory Visit: Payer: Self-pay | Admitting: Obstetrics and Gynecology

## 2016-12-23 DIAGNOSIS — R928 Other abnormal and inconclusive findings on diagnostic imaging of breast: Secondary | ICD-10-CM

## 2016-12-29 ENCOUNTER — Ambulatory Visit
Admission: RE | Admit: 2016-12-29 | Discharge: 2016-12-29 | Disposition: A | Discharge status: Home / Self Care | Payer: Managed Care, Other (non HMO) | Source: Ambulatory Visit | Attending: Obstetrics and Gynecology | Admitting: Obstetrics and Gynecology

## 2016-12-29 ENCOUNTER — Other Ambulatory Visit: Payer: Self-pay | Admitting: Obstetrics and Gynecology

## 2016-12-29 DIAGNOSIS — R928 Other abnormal and inconclusive findings on diagnostic imaging of breast: Secondary | ICD-10-CM

## 2016-12-29 DIAGNOSIS — N6310 Unspecified lump in the right breast, unspecified quadrant: Secondary | ICD-10-CM

## 2016-12-31 ENCOUNTER — Ambulatory Visit
Admission: RE | Admit: 2016-12-31 | Discharge: 2016-12-31 | Disposition: A | Discharge status: Home / Self Care | Payer: Managed Care, Other (non HMO) | Source: Ambulatory Visit | Attending: Obstetrics and Gynecology | Admitting: Obstetrics and Gynecology

## 2016-12-31 ENCOUNTER — Other Ambulatory Visit: Payer: Self-pay | Admitting: Obstetrics and Gynecology

## 2016-12-31 DIAGNOSIS — N6310 Unspecified lump in the right breast, unspecified quadrant: Secondary | ICD-10-CM

## 2017-01-11 ENCOUNTER — Ambulatory Visit (INDEPENDENT_AMBULATORY_CARE_PROVIDER_SITE_OTHER): Payer: Managed Care, Other (non HMO) | Admitting: Nurse Practitioner

## 2017-01-11 ENCOUNTER — Other Ambulatory Visit (INDEPENDENT_AMBULATORY_CARE_PROVIDER_SITE_OTHER): Payer: Managed Care, Other (non HMO)

## 2017-01-11 ENCOUNTER — Encounter: Payer: Self-pay | Admitting: Nurse Practitioner

## 2017-01-11 ENCOUNTER — Telehealth: Payer: Self-pay | Admitting: Internal Medicine

## 2017-01-11 VITALS — BP 100/66 | HR 99 | Temp 99.3°F | Ht 63.0 in | Wt 169.0 lb

## 2017-01-11 DIAGNOSIS — R10824 Left lower quadrant rebound abdominal tenderness: Secondary | ICD-10-CM

## 2017-01-11 DIAGNOSIS — R197 Diarrhea, unspecified: Secondary | ICD-10-CM

## 2017-01-11 DIAGNOSIS — D72829 Elevated white blood cell count, unspecified: Secondary | ICD-10-CM | POA: Diagnosis not present

## 2017-01-11 DIAGNOSIS — R10814 Left lower quadrant abdominal tenderness: Secondary | ICD-10-CM | POA: Diagnosis not present

## 2017-01-11 LAB — COMPREHENSIVE METABOLIC PANEL
ALK PHOS: 74 U/L (ref 39–117)
ALT: 20 U/L (ref 0–35)
AST: 18 U/L (ref 0–37)
Albumin: 3.9 g/dL (ref 3.5–5.2)
BILIRUBIN TOTAL: 0.3 mg/dL (ref 0.2–1.2)
BUN: 14 mg/dL (ref 6–23)
CO2: 29 meq/L (ref 19–32)
Calcium: 9.2 mg/dL (ref 8.4–10.5)
Chloride: 99 mEq/L (ref 96–112)
Creatinine, Ser: 0.82 mg/dL (ref 0.40–1.20)
GFR: 77.5 mL/min (ref >60.00)
GLUCOSE: 98 mg/dL (ref 70–99)
Potassium: 3.8 mEq/L (ref 3.5–5.1)
SODIUM: 137 meq/L (ref 135–145)
TOTAL PROTEIN: 7.1 g/dL (ref 6.0–8.3)

## 2017-01-11 LAB — CBC WITH DIFFERENTIAL/PLATELET
BASOS ABS: 0 10*3/uL (ref 0.0–0.1)
Basophils Relative: 0.3 % (ref 0.0–3.0)
EOS ABS: 0.1 10*3/uL (ref 0.0–0.7)
Eosinophils Relative: 0.6 % (ref 0.0–5.0)
HCT: 40.1 % (ref 36.0–46.0)
Hemoglobin: 13.6 g/dL (ref 12.0–15.0)
LYMPHS ABS: 2.2 10*3/uL (ref 0.7–4.0)
Lymphocytes Relative: 15.4 % (ref 12.0–46.0)
MCHC: 33.8 g/dL (ref 30.0–36.0)
MCV: 87.1 fl (ref 78.0–100.0)
MONO ABS: 1.5 10*3/uL — AB (ref 0.1–1.0)
Monocytes Relative: 10.7 % (ref 3.0–12.0)
NEUTROS ABS: 10.5 10*3/uL — AB (ref 1.4–7.7)
NEUTROS PCT: 73 % (ref 43.0–77.0)
Platelets: 327 10*3/uL (ref 150.0–400.0)
RBC: 4.61 Mil/uL (ref 3.87–5.11)
RDW: 13 % (ref 11.5–15.5)
WBC: 14.3 10*3/uL — ABNORMAL HIGH (ref 4.0–10.5)

## 2017-01-11 LAB — LIPASE: Lipase: 9 U/L — ABNORMAL LOW (ref 11.0–59.0)

## 2017-01-11 LAB — AMYLASE: Amylase: 23 U/L — ABNORMAL LOW (ref 27–131)

## 2017-01-11 MED ORDER — ONDANSETRON 4 MG PO TBDP: 4.0000 mg | ORAL_TABLET | Freq: Three times a day (TID) | ORAL | 0 refills | Status: DC | PRN

## 2017-01-11 MED ORDER — METRONIDAZOLE 500 MG PO TABS: 500.0000 mg | ORAL_TABLET | Freq: Three times a day (TID) | ORAL | 0 refills | Status: DC

## 2017-01-11 NOTE — Patient Instructions (Signed)
Maintain clear liquid diet for 24-48hrs, then advance to brat diet for 48hrs, the advance as tolerated.  It is very important to increase oral hydration with Gatorade or Pedialyte and water.  Go to basement for blood draw.  you will be called with results.  Will order CT ABD/pelvis if elevated WBC.  Clear Liquid Diet A clear liquid diet means that you only have liquids that you can see through. You do not eat any food on this diet. Most people need to follow this diet for only a short time. What do I need to know about this diet?  A clear liquid is a liquid that you can see through when you hold it up to a light.  This diet does not give you all the nutrients that you need. Choose a variety of the liquids that your doctor says you can drink on this diet. That way, you will get as many nutrients as possible.  If you are not sure whether you can have certain items, ask your doctor. What can I have?  Water and flavored water.  Fruit juices that do not have pulp, such as cranberry juice and apple juice.  Tea and coffee without milk or cream.  Clear bouillon or broth.  Broth-based soups that have been strained.  Flavored gelatins.  Honey.  Sugar water.  Frozen ice or frozen ice pops that do not have any milk, yogurt, fruit pieces, or fruit pulp in them.  Clear sodas.  Clear sports drinks. The items listed above may not be a complete list of recommended liquids. Contact your food and nutrition expert (dietitian) for more options. What can I not have?  Juices that have pulp.  Milk.  Cream or cream-based soups.  Yogurt. The items listed above may not be a complete list of liquids to avoid. Contact your food and nutrition expert for more information. Summary  A clear liquid diet is a diet that includes only liquids that you can see through.  The goal of this diet is to help you recover.  Make sure to avoid liquids with milk, cream, or pulp while you are on this  diet. This information is not intended to replace advice given to you by your health care provider. Make sure you discuss any questions you have with your health care provider. Document Released: 02/20/2008 Document Revised: 10/21/2015 Document Reviewed: 02/03/2013 Elsevier Interactive Patient Education  2017 [employer].   Food Choices to Help Relieve Diarrhea, Adult When you have diarrhea, the foods you eat and your eating habits are very important. Choosing the right foods and drinks can help:  Relieve diarrhea.  Replace lost fluids and nutrients.  Prevent dehydration.  What general guidelines should I follow? Relieving diarrhea  Choose foods with less than 2 g or .07 oz. of fiber per serving.  Limit fats to less than 8 tsp (38 g or 1.34 oz.) a day.  Avoid the following: ? Foods and beverages sweetened with high-fructose corn syrup, honey, or sugar alcohols such as xylitol, sorbitol, and mannitol. ? Foods that contain a lot of fat or sugar. ? Fried, greasy, or spicy foods. ? High-fiber grains, breads, and cereals. ? Raw fruits and vegetables.  Eat foods that are rich in probiotics. These foods include dairy products such as yogurt and fermented milk products. They help increase healthy bacteria in the stomach and intestines (gastrointestinal tract, or GI tract).  If you have lactose intolerance, avoid dairy products. These may make your diarrhea worse.  Take  medicine to help stop diarrhea (antidiarrheal medicine) only as told by your health care provider. Replacing nutrients  Eat small meals or snacks every 3-4 hours.  Eat bland foods, such as white rice, toast, or baked potato, until your diarrhea starts to get better. Gradually reintroduce nutrient-rich foods as tolerated or as told by your health care provider. This includes: ? Well-cooked protein foods. ? Peeled, seeded, and soft-cooked fruits and vegetables. ? Low-fat dairy products.  Take vitamin and mineral  supplements as told by your health care provider. Preventing dehydration   Start by sipping water or a special solution to prevent dehydration (oral rehydration solution, ORS). Urine that is clear or pale yellow means that you are getting enough fluid.  Try to drink at least 8-10 cups of fluid each day to help replace lost fluids.  You may add other liquids in addition to water, such as clear juice or decaffeinated sports drinks, as tolerated or as told by your health care provider.  Avoid drinks with caffeine, such as coffee, tea, or soft drinks.  Avoid alcohol. What foods are recommended? The items listed may not be a complete list. Talk with your health care provider about what dietary choices are best for you. Grains White rice. White, [place], or pita breads (fresh or toasted), including plain rolls, buns, or bagels. White pasta. Saltine, soda, or graham crackers. Pretzels. Low-fiber cereal. Cooked cereals made with water (such as cornmeal, farina, or cream cereals). Plain muffins. Matzo. Melba toast. Zwieback. Vegetables Potatoes (without the skin). Most well-cooked and canned vegetables without skins or seeds. Tender lettuce. Fruits Apple sauce. Fruits canned in juice. Cooked apricots, cherries, grapefruit, peaches, pears, or plums. Fresh bananas and cantaloupe. Meats and other protein foods Baked or boiled chicken. Eggs. Tofu. Fish. Seafood. Smooth nut butters. Ground or well-cooked tender beef, ham, veal, lamb, pork, or poultry. Dairy Plain yogurt, kefir, and unsweetened liquid yogurt. Lactose-free milk, buttermilk, skim milk, or soy milk. Low-fat or nonfat hard cheese. Beverages Water. Low-calorie sports drinks. Fruit juices without pulp. Strained tomato and vegetable juices. Decaffeinated teas. Sugar-free beverages not sweetened with sugar alcohols. Oral rehydration solutions, if approved by your health care provider. Seasoning and other foods Bouillon, broth, or soups made from  recommended foods. What foods are not recommended? The items listed may not be a complete list. Talk with your health care provider about what dietary choices are best for you. Grains Whole grain, whole wheat, bran, or rye breads, rolls, pastas, and crackers. Wild or brown rice. Whole grain or bran cereals. Barley. Oats and oatmeal. Corn tortillas or taco shells. Granola. Popcorn. Vegetables Raw vegetables. Fried vegetables. Cabbage, broccoli, Brussels sprouts, artichokes, baked beans, beet greens, corn, kale, legumes, peas, sweet potatoes, and yams. Potato skins. Cooked spinach and cabbage. Fruits Dried fruit, including raisins and dates. Raw fruits. Stewed or dried prunes. Canned fruits with syrup. Meat and other protein foods Fried or fatty meats. Deli meats. Chunky nut butters. Nuts and seeds. Beans and lentils. Tomasa Blase. Hot dogs. Sausage. Dairy High-fat cheeses. Whole milk, chocolate milk, and beverages made with milk, such as milk shakes. Half-and-half. Cream. sour cream. Ice cream. Beverages Caffeinated beverages (such as coffee, tea, soda, or energy drinks). Alcoholic beverages. Fruit juices with pulp. Prune juice. Soft drinks sweetened with high-fructose corn syrup or sugar alcohols. High-calorie sports drinks. Fats and oils Butter. Cream sauces. Margarine. Salad oils. Plain salad dressings. Olives. Avocados. Mayonnaise. Sweets and desserts Sweet rolls, doughnuts, and sweet breads. Sugar-free desserts sweetened with sugar alcohols such  as xylitol and sorbitol. Seasoning and other foods Honey. Hot sauce. Chili powder. Gravy. Cream-based or milk-based soups. Pancakes and waffles. Summary  When you have diarrhea, the foods you eat and your eating habits are very important.  Make sure you get at least 8-10 cups of fluid each day, or enough to keep your urine clear or pale yellow.  Eat bland foods and gradually reintroduce healthy, nutrient-rich foods as tolerated, or as told by your  health care provider.  Avoid high-fiber, fried, greasy, or spicy foods. This information is not intended to replace advice given to you by your health care provider. Make sure you discuss any questions you have with your health care provider. Document Released: 05/30/2003 Document Revised: 03/06/2016 Document Reviewed: 03/06/2016 Elsevier Interactive Patient Education  2017 [employer]

## 2017-01-11 NOTE — Telephone Encounter (Signed)
[facility] - Client TELEPHONE ADVICE RECORD [facility]  Patient Name: Arley PhenixLIZABETH Lufkin  DOB: 05-Jul-1965    Initial Comment Caller states she has had diarrhea for a week. Dizziness started Friday.   Nurse Assessment  Nurse: Laural BenesJohnson, RN, Dondra SpryGail Date/Time Lamount Cohen(Eastern Time): 01/11/2017 9:21:01 AM  Confirm and document reason for call. If symptomatic, describe symptoms. ---[name] has had diarrhea for one week too many episodes daily to count; Saturday started having dizziness -- not able to eat a lot has nausea no vomiting  Does the patient have any new or worsening symptoms? ---Yes  Will a triage be completed? ---Yes  Related visit to physician within the last 2 weeks? ---No  Does the PT have any chronic conditions? (i.e. diabetes, asthma, etc.) ---No  Is the patient pregnant or possibly pregnant? (Ask all females between the ages of 1112-55) ---No  Is this a behavioral health or substance abuse call? ---No     Guidelines    Guideline Title Affirmed Question Affirmed Notes  Diarrhea [1] MODERATE diarrhea (e.g., 4-6 times / day more than normal) AND [2] present > 48 hours (2 days)    Final Disposition User   See Physician within 24 Hours Laural BenesJohnson, RN, Dondra SpryGail    Comments  NOTE appointment given with NP Nche [phone] at 130pm (no availability with PCP)   Referrals  REFERRED TO PCP OFFICE

## 2017-01-11 NOTE — Progress Notes (Signed)
Subjective:  Patient ID: Megan Medina, female    DOB: [birth year]  Age: 51 y.o. MRN: [id number]  CC: Diarrhea (diarrhea--1 wk/ dizzy--2 days. )   Diarrhea   This is a new problem. The current episode started in the past 7 days. The problem occurs 2 to 4 times per day. The problem has been unchanged. The stool consistency is described as mucous and watery. The patient states that diarrhea does not awaken her from sleep. Associated symptoms include abdominal pain, bloating, chills and myalgias. Pertinent negatives include no headaches. Associated symptoms comments: Light headedness. Risk factors: oral abx used 09/2016. She has tried bismuth subsalicylate for the symptoms. The treatment provided mild relief.  sigmoidoscopy done 1991 (normal). Use of oral abx used 09/2016 (clindamycin for sinusitis).  Outpatient Medications Prior to Visit  Medication Sig Dispense Refill  . albuterol (PROVENTIL HFA;VENTOLIN HFA) 108 (90 Base) MCG/ACT inhaler Inhale 2 puffs into the lungs every 6 (six) hours as needed for wheezing or shortness of breath. 1 Inhaler 0  . fluticasone (FLONASE) 50 MCG/ACT nasal spray Place 2 sprays into both nostrils daily. 16 g 6  . meclizine (ANTIVERT) 25 MG tablet Take 1 tablet (25 mg total) by mouth 3 (three) times daily as needed for dizziness. 30 tablet 0  . montelukast (SINGULAIR) 10 MG tablet Take 1 tablet (10 mg total) by mouth at bedtime. 30 tablet 3  . valACYclovir (VALTREX) 500 MG tablet Take 1 tablet by mouth daily.    . ondansetron (ZOFRAN ODT) 4 MG disintegrating tablet Take 1 tablet (4 mg total) by mouth every 8 (eight) hours as needed for nausea or vomiting. 20 tablet 0  . azithromycin (ZITHROMAX Z-PAK) 250 MG tablet 2 tab by mouth day 1, then 1 per day (Patient not taking: Reported on 01/11/2017) 6 tablet 1  . budesonide-formoterol (SYMBICORT) 160-4.5 MCG/ACT inhaler Inhale 2 puffs into the lungs 2 (two) times daily. (Patient not taking: Reported on  01/11/2017) 1 Inhaler 6  . clonazePAM (KLONOPIN) 0.5 MG tablet Take 1 tablet (0.5 mg total) by mouth 2 (two) times daily as needed (vertigo). 20 tablet 0  . escitalopram (LEXAPRO) 20 MG tablet Take 20 mg by mouth daily.    . promethazine-dextromethorphan (PROMETHAZINE-DM) 6.25-15 MG/5ML syrup Take 5 mLs by mouth 4 (four) times daily as needed for cough. (Patient not taking: Reported on 01/11/2017) 118 mL 0   No facility-administered medications prior to visit.     ROS See HPI  Objective:  BP 100/66   Pulse 99   Temp 99.3 F (37.4 C)   Ht 5\' 3"  (1.6 m)   Wt 169 lb (76.7 kg)   SpO2 98%   BMI 29.94 kg/m   BP Readings from Last 3 Encounters:  01/11/17 100/66  06/16/16 130/78  05/30/16 122/76    Wt Readings from Last 3 Encounters:  01/11/17 169 lb (76.7 kg)  06/16/16 171 lb (77.6 kg)  05/30/16 168 lb (76.2 kg)    Physical Exam  Constitutional: She is oriented to person, place, and time. No distress.  Cardiovascular: Normal rate.   Pulmonary/Chest: Effort normal.  Abdominal: She exhibits no distension. There is tenderness. There is no rebound and no guarding.  LLQ tenderness  Neurological: She is alert and oriented to person, place, and time.  Skin: Skin is warm and dry. No rash noted.  Vitals reviewed.   Lab Results  Component Value Date   WBC 14.3 (H) 01/11/2017   HGB 13.6 01/11/2017   HCT 40.1 01/11/2017  PLT 327.0 01/11/2017   GLUCOSE 98 01/11/2017   ALT 20 01/11/2017   AST 18 01/11/2017   NA 137 01/11/2017   K 3.8 01/11/2017   CL 99 01/11/2017   CREATININE 0.82 01/11/2017   BUN 14 01/11/2017   CO2 29 01/11/2017   TSH 1.37 09/23/2010    Mm Clip Placement Right  Result Date: 12/31/2016 CLINICAL DATA:  Status post stereotactic guided core biopsy of mass in the upper-outer quadrant of the right breast. EXAM: DIAGNOSTIC RIGHT MAMMOGRAM POST STEREOTACTIC BIOPSY COMPARISON:  Previous exam(s). FINDINGS: Mammographic images were obtained following stereotactic  guided biopsy of mass in the upper-outer quadrant of the right breast. A coil shaped clip is identified in the upper-outer quadrant following biopsy, within the expected location, caudal to the ribbon shaped clip placed the time of previous benign biopsy showing fibrocystic changes. IMPRESSION: Tissue marker clip in the expected location following biopsy. Final Assessment: Post Procedure Mammograms for Marker Placement Electronically Signed   By: Norva PavlovElizabeth  Brown M.D.   On: 12/31/2016 09:47   Mm Rt Breast Bx W Loc Dev 1st Lesion Image Bx Spec Stereo Guide  Addendum Date: 01/01/2017   ADDENDUM REPORT: 01/01/2017 14:16 ADDENDUM: Pathology revealed FIBROCYSTIC CHANGE of the Right breast, upper outer. This was found to be concordant by Dr. Norva PavlovElizabeth Brown. Pathology results were discussed with the patient by telephone. The patient reported doing well after the biopsy with tenderness at the site. Post biopsy instructions and care were reviewed and questions were answered. The patient was encouraged to call [facility] for any additional concerns. The patient was instructed to return for right diagnostic mammography in 6 months and informed a reminder notice would be sent regarding this appointment. Pathology results reported by Rene KocherLynne Bailey, RN on 01/01/2017. Electronically Signed   By: Norva PavlovElizabeth  Brown M.D.   On: 01/01/2017 14:16   Result Date: 01/01/2017 CLINICAL DATA:  The patient presents for stereotactic guided core biopsy of mass in the upper-outer quadrant of the right breast. EXAM: RIGHT BREAST STEREOTACTIC CORE NEEDLE BIOPSY COMPARISON:  Previous exams. FINDINGS: The patient and I discussed the procedure of stereotactic-guided biopsy including benefits and alternatives. We discussed the high likelihood of a successful procedure. We discussed the risks of the procedure including infection, bleeding, tissue injury, clip migration, and inadequate sampling. Informed written  consent was given. The usual time out protocol was performed immediately prior to the procedure. Using sterile technique and 1% Lidocaine as local anesthetic, under stereotactic guidance, a 9 gauge vacuum assisted device was used to perform core needle biopsy of mass within the upper-outer quadrant of the right breast using a superior to inferior approach. Lesion quadrant: Upper-outer quadrant right breast At the conclusion of the procedure, a coil shaped tissue marker clip was deployed into the biopsy cavity. Follow-up 2-view mammogram was performed and dictated separately. IMPRESSION: Stereotactic-guided biopsy of mass in the upper-outer quadrant of the right breast. No apparent complications. Electronically Signed: By: Norva PavlovElizabeth  Brown M.D. On: 12/31/2016 09:45    Assessment & Plan:   [name] was seen today for diarrhea.  Diagnoses and all orders for this visit:  Diarrhea, unspecified type -     CBC w/Diff; Future -     Comprehensive metabolic panel; Future -     Lipase; Future -     Amylase; Future -     Discontinue: ondansetron (ZOFRAN ODT) 4 MG disintegrating tablet; Take 1 tablet (4 mg total) by mouth every 8 (eight) hours as needed  for nausea or vomiting. -     ondansetron (ZOFRAN ODT) 4 MG disintegrating tablet; Take 1 tablet (4 mg total) by mouth every 8 (eight) hours as needed for nausea or vomiting. -     CT ABDOMEN PELVIS W WO CONTRAST; Future -     metroNIDAZOLE (FLAGYL) 500 MG tablet; Take 1 tablet (500 mg total) by mouth 3 (three) times daily. -     Clostridium Difficile by PCR; Future  Left lower quadrant abdominal tenderness without rebound tenderness -     CBC w/Diff; Future -     Comprehensive metabolic panel; Future -     Lipase; Future -     Amylase; Future -     Discontinue: ondansetron (ZOFRAN ODT) 4 MG disintegrating tablet; Take 1 tablet (4 mg total) by mouth every 8 (eight) hours as needed for nausea or vomiting. -     ondansetron (ZOFRAN ODT) 4 MG  disintegrating tablet; Take 1 tablet (4 mg total) by mouth every 8 (eight) hours as needed for nausea or vomiting. -     CT ABDOMEN PELVIS W WO CONTRAST; Future -     metroNIDAZOLE (FLAGYL) 500 MG tablet; Take 1 tablet (500 mg total) by mouth 3 (three) times daily.  Leukocytosis, unspecified type -     CT ABDOMEN PELVIS W WO CONTRAST; Future -     metroNIDAZOLE (FLAGYL) 500 MG tablet; Take 1 tablet (500 mg total) by mouth 3 (three) times daily. -     Clostridium Difficile by PCR; Future   I am having Ms. Clinton Sawyer start on metroNIDAZOLE. I am also having her maintain her escitalopram, clonazePAM, meclizine, valACYclovir, budesonide-formoterol, albuterol, promethazine-dextromethorphan, fluticasone, montelukast, azithromycin, and ondansetron.  Meds ordered this encounter  Medications  . DISCONTD: ondansetron (ZOFRAN ODT) 4 MG disintegrating tablet    Sig: Take 1 tablet (4 mg total) by mouth every 8 (eight) hours as needed for nausea or vomiting.    Dispense:  20 tablet    Refill:  0    Order Specific Question:   Supervising Provider    Answer:   Pincus Sanes [[id number]]  . ondansetron (ZOFRAN ODT) 4 MG disintegrating tablet    Sig: Take 1 tablet (4 mg total) by mouth every 8 (eight) hours as needed for nausea or vomiting.    Dispense:  20 tablet    Refill:  0    Order Specific Question:   Supervising Provider    Answer:   Pincus Sanes [[id number]]  . metroNIDAZOLE (FLAGYL) 500 MG tablet    Sig: Take 1 tablet (500 mg total) by mouth 3 (three) times daily.    Dispense:  21 tablet    Refill:  0    Order Specific Question:   Supervising Provider    Answer:   Pincus Sanes [[id number]]    Follow-up: No Follow-up on file.  Alysia Penna, NP

## 2017-01-12 ENCOUNTER — Telehealth: Payer: Self-pay

## 2017-01-12 ENCOUNTER — Ambulatory Visit (INDEPENDENT_AMBULATORY_CARE_PROVIDER_SITE_OTHER)
Admission: RE | Admit: 2017-01-12 | Discharge: 2017-01-12 | Disposition: A | Discharge status: Home / Self Care | Payer: Managed Care, Other (non HMO) | Source: Ambulatory Visit | Attending: Nurse Practitioner | Admitting: Nurse Practitioner

## 2017-01-12 DIAGNOSIS — D72829 Elevated white blood cell count, unspecified: Secondary | ICD-10-CM

## 2017-01-12 DIAGNOSIS — R10814 Left lower quadrant abdominal tenderness: Secondary | ICD-10-CM

## 2017-01-12 DIAGNOSIS — R197 Diarrhea, unspecified: Secondary | ICD-10-CM

## 2017-01-12 DIAGNOSIS — K529 Noninfective gastroenteritis and colitis, unspecified: Secondary | ICD-10-CM

## 2017-01-12 MED ORDER — IOPAMIDOL (ISOVUE-300) INJECTION 61%
100.0000 mL | Freq: Once | INTRAVENOUS | Status: AC | PRN
  Administered 2017-01-12: 100 mL via INTRAVENOUS

## 2017-01-12 NOTE — Addendum Note (Signed)
Addended by: Tresa GarterPLOTNIKOV, Araminta Zorn V on: 01/12/2017 11:02 PM   Modules accepted: Orders

## 2017-01-12 NOTE — Telephone Encounter (Addendum)
Pls inform - CT shows that the pt has colitis - inflammation of the bowel lining. Cont Rx. I'll ref to GI. Thx

## 2017-01-12 NOTE — Telephone Encounter (Signed)
Imaging dept is calling report on CT of the abdomen for this patient, ordered by charlotte nche----since charlotte is out of office today and tomorrow, I am routing report to dr plotnikov---please advise if we need to do anything else ,, thanks

## 2017-01-13 ENCOUNTER — Telehealth: Payer: Self-pay | Admitting: Nurse Practitioner

## 2017-01-13 ENCOUNTER — Encounter: Payer: Self-pay | Admitting: *Deleted

## 2017-01-13 NOTE — Telephone Encounter (Signed)
Pt called and states she needs a doctors note faxed to her job stating she has been under the doctors care since 10/22, please fax to [phone] Please advise

## 2017-01-13 NOTE — Telephone Encounter (Signed)
Patient advised of dr plotnikovs instructions, GI has already called patient and she has made appt on Friday 10/26

## 2017-01-14 ENCOUNTER — Other Ambulatory Visit: Payer: Self-pay | Admitting: Nurse Practitioner

## 2017-01-14 ENCOUNTER — Encounter: Payer: Self-pay | Admitting: Nurse Practitioner

## 2017-01-14 ENCOUNTER — Encounter (HOSPITAL_COMMUNITY): Payer: Self-pay | Admitting: Emergency Medicine

## 2017-01-14 DIAGNOSIS — K529 Noninfective gastroenteritis and colitis, unspecified: Secondary | ICD-10-CM

## 2017-01-14 DIAGNOSIS — J4520 Mild intermittent asthma, uncomplicated: Secondary | ICD-10-CM | POA: Diagnosis present

## 2017-01-14 DIAGNOSIS — K589 Irritable bowel syndrome without diarrhea: Secondary | ICD-10-CM | POA: Diagnosis present

## 2017-01-14 DIAGNOSIS — Z885 Allergy status to narcotic agent status: Secondary | ICD-10-CM

## 2017-01-14 DIAGNOSIS — A0472 Enterocolitis due to Clostridium difficile, not specified as recurrent: Secondary | ICD-10-CM | POA: Diagnosis not present

## 2017-01-14 DIAGNOSIS — F419 Anxiety disorder, unspecified: Secondary | ICD-10-CM | POA: Diagnosis present

## 2017-01-14 DIAGNOSIS — E871 Hypo-osmolality and hyponatremia: Secondary | ICD-10-CM | POA: Diagnosis present

## 2017-01-14 DIAGNOSIS — E861 Hypovolemia: Secondary | ICD-10-CM | POA: Diagnosis present

## 2017-01-14 DIAGNOSIS — E876 Hypokalemia: Secondary | ICD-10-CM | POA: Diagnosis not present

## 2017-01-14 DIAGNOSIS — Z79899 Other long term (current) drug therapy: Secondary | ICD-10-CM

## 2017-01-14 LAB — BASIC METABOLIC PANEL
Anion gap: 13 (ref 5–15)
BUN: 8 mg/dL (ref 6–20)
CALCIUM: 8.8 mg/dL — AB (ref 8.9–10.3)
CO2: 27 mmol/L (ref 22–32)
CREATININE: 0.94 mg/dL (ref 0.44–1.00)
Chloride: 93 mmol/L — ABNORMAL LOW (ref 101–111)
GFR calc non Af Amer: >60 mL/min (ref >60)
Glucose, Bld: 113 mg/dL — ABNORMAL HIGH (ref 65–99)
Potassium: 3.8 mmol/L (ref 3.5–5.1)
SODIUM: 133 mmol/L — AB (ref 135–145)

## 2017-01-14 LAB — CBC
HCT: 45 % (ref 36.0–46.0)
Hemoglobin: 15.5 g/dL — ABNORMAL HIGH (ref 12.0–15.0)
MCH: 29.7 pg (ref 26.0–34.0)
MCHC: 34.4 g/dL (ref 30.0–36.0)
MCV: 86.2 fL (ref 78.0–100.0)
PLATELETS: 399 10*3/uL (ref 150–400)
RBC: 5.22 MIL/uL — ABNORMAL HIGH (ref 3.87–5.11)
RDW: 12.9 % (ref 11.5–15.5)
WBC: 27.4 10*3/uL — AB (ref 4.0–10.5)

## 2017-01-14 LAB — URINALYSIS, ROUTINE W REFLEX MICROSCOPIC
Bacteria, UA: NONE SEEN
GLUCOSE, UA: NEGATIVE mg/dL
HGB URINE DIPSTICK: NEGATIVE
Ketones, ur: 20 mg/dL — AB
NITRITE: NEGATIVE
Protein, ur: 30 mg/dL — AB
SPECIFIC GRAVITY, URINE: 1.028 (ref 1.005–1.030)
Squamous Epithelial / LPF: NONE SEEN
pH: 5 (ref 5.0–8.0)

## 2017-01-14 MED ORDER — CIPROFLOXACIN HCL 500 MG PO TABS: 500.0000 mg | ORAL_TABLET | Freq: Two times a day (BID) | ORAL | 0 refills | Status: DC

## 2017-01-14 NOTE — Telephone Encounter (Signed)
Letter written and signed.

## 2017-01-14 NOTE — Telephone Encounter (Signed)
Spoke with pt. Letter faxed to [phone].   Advise pt to go to the ER if not better, cant drink and diarrhea still going on. Pt verbalized understand.

## 2017-01-14 NOTE — ED Triage Notes (Signed)
Pt c/o dizziness and diarrhea x 1 week. Pt has seen by PCP for same, currently taking antibiotics for colitis, states no improvement in symptoms.

## 2017-01-14 NOTE — Telephone Encounter (Signed)
Spoke with pt, she said she still experiencing diarrhea,ligh headedness and nausea that why she is unable to go back to work since Monday (she has not start Cipro yet,spouse will pick it up tonight). Pt is hopping to get out of work note from 01/11/2017-01/14/2017. Advise her about okey for 10/22-10/23 but pt request to ask about other days?

## 2017-01-15 ENCOUNTER — Telehealth: Payer: Self-pay | Admitting: Internal Medicine

## 2017-01-15 ENCOUNTER — Inpatient Hospital Stay (HOSPITAL_COMMUNITY)
Admission: EM | Admit: 2017-01-15 | Discharge: 2017-01-17 | DRG: 372 | Disposition: A | Discharge status: Home / Self Care | Payer: Managed Care, Other (non HMO) | Attending: Family Medicine | Admitting: Family Medicine

## 2017-01-15 ENCOUNTER — Ambulatory Visit: Payer: Managed Care, Other (non HMO) | Admitting: Internal Medicine

## 2017-01-15 ENCOUNTER — Encounter (HOSPITAL_COMMUNITY): Payer: Self-pay | Admitting: Family Medicine

## 2017-01-15 ENCOUNTER — Emergency Department (HOSPITAL_COMMUNITY): Payer: Managed Care, Other (non HMO)

## 2017-01-15 DIAGNOSIS — J45909 Unspecified asthma, uncomplicated: Secondary | ICD-10-CM | POA: Diagnosis present

## 2017-01-15 DIAGNOSIS — K5100 Ulcerative (chronic) pancolitis without complications: Secondary | ICD-10-CM | POA: Diagnosis present

## 2017-01-15 DIAGNOSIS — F419 Anxiety disorder, unspecified: Secondary | ICD-10-CM | POA: Diagnosis present

## 2017-01-15 DIAGNOSIS — Z79899 Other long term (current) drug therapy: Secondary | ICD-10-CM | POA: Diagnosis not present

## 2017-01-15 DIAGNOSIS — J4520 Mild intermittent asthma, uncomplicated: Secondary | ICD-10-CM | POA: Diagnosis present

## 2017-01-15 DIAGNOSIS — E876 Hypokalemia: Secondary | ICD-10-CM | POA: Diagnosis not present

## 2017-01-15 DIAGNOSIS — K589 Irritable bowel syndrome without diarrhea: Secondary | ICD-10-CM | POA: Diagnosis present

## 2017-01-15 DIAGNOSIS — Z885 Allergy status to narcotic agent status: Secondary | ICD-10-CM | POA: Diagnosis not present

## 2017-01-15 DIAGNOSIS — E871 Hypo-osmolality and hyponatremia: Secondary | ICD-10-CM | POA: Diagnosis present

## 2017-01-15 DIAGNOSIS — K529 Noninfective gastroenteritis and colitis, unspecified: Secondary | ICD-10-CM | POA: Diagnosis not present

## 2017-01-15 DIAGNOSIS — E861 Hypovolemia: Secondary | ICD-10-CM | POA: Diagnosis present

## 2017-01-15 DIAGNOSIS — A0472 Enterocolitis due to Clostridium difficile, not specified as recurrent: Secondary | ICD-10-CM | POA: Diagnosis present

## 2017-01-15 LAB — CBC WITH DIFFERENTIAL/PLATELET
BAND NEUTROPHILS: 9 %
BASOS ABS: 0 10*3/uL (ref 0.0–0.1)
Basophils Relative: 0 %
Blasts: 0 %
EOS ABS: 0 10*3/uL (ref 0.0–0.7)
EOS PCT: 0 %
HCT: 39.7 % (ref 36.0–46.0)
Hemoglobin: 13.6 g/dL (ref 12.0–15.0)
LYMPHS ABS: 1.4 10*3/uL (ref 0.7–4.0)
Lymphocytes Relative: 6 %
MCH: 29.4 pg (ref 26.0–34.0)
MCHC: 34.3 g/dL (ref 30.0–36.0)
MCV: 85.7 fL (ref 78.0–100.0)
METAMYELOCYTES PCT: 0 %
MONO ABS: 1.2 10*3/uL — AB (ref 0.1–1.0)
MONOS PCT: 5 %
Myelocytes: 0 %
NEUTROS ABS: 20.4 10*3/uL — AB (ref 1.7–7.7)
Neutrophils Relative %: 80 %
Other: 0 %
PLATELETS: 330 10*3/uL (ref 150–400)
Promyelocytes Absolute: 0 %
RBC: 4.63 MIL/uL (ref 3.87–5.11)
RDW: 13 % (ref 11.5–15.5)
WBC: 23 10*3/uL — ABNORMAL HIGH (ref 4.0–10.5)
nRBC: 0 /100 WBC

## 2017-01-15 LAB — GASTROINTESTINAL PANEL BY PCR, STOOL (REPLACES STOOL CULTURE)

## 2017-01-15 LAB — C DIFFICILE QUICK SCREEN W PCR REFLEX
C DIFFICLE (CDIFF) ANTIGEN: POSITIVE — AB
C Diff toxin: NEGATIVE

## 2017-01-15 LAB — COMPREHENSIVE METABOLIC PANEL
ALBUMIN: 2.8 g/dL — AB (ref 3.5–5.0)
ALT: 16 U/L (ref 14–54)
AST: 15 U/L (ref 15–41)
Alkaline Phosphatase: 89 U/L (ref 38–126)
Anion gap: 12 (ref 5–15)
BILIRUBIN TOTAL: 0.6 mg/dL (ref 0.3–1.2)
BUN: 7 mg/dL (ref 6–20)
CHLORIDE: 100 mmol/L — AB (ref 101–111)
CO2: 22 mmol/L (ref 22–32)
Calcium: 8.1 mg/dL — ABNORMAL LOW (ref 8.9–10.3)
Creatinine, Ser: 0.77 mg/dL (ref 0.44–1.00)
GFR calc Af Amer: >60 mL/min (ref >60)
GFR calc non Af Amer: >60 mL/min (ref >60)
GLUCOSE: 100 mg/dL — AB (ref 65–99)
POTASSIUM: 3.1 mmol/L — AB (ref 3.5–5.1)
Sodium: 134 mmol/L — ABNORMAL LOW (ref 135–145)
TOTAL PROTEIN: 6 g/dL — AB (ref 6.5–8.1)

## 2017-01-15 LAB — CLOSTRIDIUM DIFFICILE BY PCR: Toxigenic C. Difficile by PCR: POSITIVE — AB

## 2017-01-15 LAB — HIV ANTIBODY (ROUTINE TESTING W REFLEX): HIV Screen 4th Generation wRfx: NONREACTIVE

## 2017-01-15 MED ORDER — METRONIDAZOLE IN NACL 5-0.79 MG/ML-% IV SOLN: 500.0000 mg | Freq: Three times a day (TID) | INTRAVENOUS | Status: DC

## 2017-01-15 MED ORDER — ACETAMINOPHEN 650 MG RE SUPP: 650.0000 mg | Freq: Four times a day (QID) | RECTAL | Status: DC | PRN

## 2017-01-15 MED ORDER — ONDANSETRON HCL 4 MG/2ML IJ SOLN
4.0000 mg | Freq: Once | INTRAMUSCULAR | Status: AC
  Administered 2017-01-15: 4 mg via INTRAVENOUS
  Filled 2017-01-15: qty 2

## 2017-01-15 MED ORDER — FLUTICASONE PROPIONATE 50 MCG/ACT NA SUSP
2.0000 | Freq: Every day | NASAL | Status: DC
  Filled 2017-01-15: qty 16

## 2017-01-15 MED ORDER — ENOXAPARIN SODIUM 40 MG/0.4ML ~~LOC~~ SOLN
40.0000 mg | SUBCUTANEOUS | Status: DC
  Administered 2017-01-15 – 2017-01-16 (×2): 40 mg via SUBCUTANEOUS
  Filled 2017-01-15 (×2): qty 0.4

## 2017-01-15 MED ORDER — ESCITALOPRAM OXALATE 20 MG PO TABS
20.0000 mg | ORAL_TABLET | Freq: Every day | ORAL | Status: DC
  Administered 2017-01-15 – 2017-01-16 (×2): 20 mg via ORAL
  Filled 2017-01-15 (×2): qty 1

## 2017-01-15 MED ORDER — METRONIDAZOLE IN NACL 5-0.79 MG/ML-% IV SOLN
500.0000 mg | Freq: Once | INTRAVENOUS | Status: AC
  Administered 2017-01-15: 500 mg via INTRAVENOUS
  Filled 2017-01-15: qty 100

## 2017-01-15 MED ORDER — CIPROFLOXACIN IN D5W 400 MG/200ML IV SOLN: 400.0000 mg | Freq: Two times a day (BID) | INTRAVENOUS | Status: DC

## 2017-01-15 MED ORDER — ESCITALOPRAM OXALATE 20 MG PO TABS
20.0000 mg | ORAL_TABLET | Freq: Every day | ORAL | Status: DC
  Filled 2017-01-15: qty 2

## 2017-01-15 MED ORDER — ACETAMINOPHEN 325 MG PO TABS: 650.0000 mg | ORAL_TABLET | Freq: Four times a day (QID) | ORAL | Status: DC | PRN

## 2017-01-15 MED ORDER — SODIUM CHLORIDE 0.9 % IV SOLN
INTRAVENOUS | Status: AC
  Administered 2017-01-15: 17:00:00 via INTRAVENOUS

## 2017-01-15 MED ORDER — MONTELUKAST SODIUM 10 MG PO TABS: 10.0000 mg | ORAL_TABLET | Freq: Every day | ORAL | Status: DC

## 2017-01-15 MED ORDER — VANCOMYCIN 50 MG/ML ORAL SOLUTION
125.0000 mg | Freq: Four times a day (QID) | ORAL | Status: DC
Start: 2017-01-15 — End: 2017-01-17
  Administered 2017-01-15 – 2017-01-17 (×8): 125 mg via ORAL
  Filled 2017-01-15 (×9): qty 2.5

## 2017-01-15 MED ORDER — VALACYCLOVIR HCL 500 MG PO TABS
500.0000 mg | ORAL_TABLET | Freq: Every day | ORAL | Status: DC
  Administered 2017-01-15 – 2017-01-17 (×3): 500 mg via ORAL
  Filled 2017-01-15 (×3): qty 1

## 2017-01-15 MED ORDER — SODIUM CHLORIDE 0.9 % IV BOLUS (SEPSIS)
1000.0000 mL | Freq: Once | INTRAVENOUS | Status: AC
  Administered 2017-01-15: 1000 mL via INTRAVENOUS

## 2017-01-15 MED ORDER — ALBUTEROL SULFATE (2.5 MG/3ML) 0.083% IN NEBU: 3.0000 mL | INHALATION_SOLUTION | Freq: Four times a day (QID) | RESPIRATORY_TRACT | Status: DC | PRN

## 2017-01-15 MED ORDER — MECLIZINE HCL 25 MG PO TABS
25.0000 mg | ORAL_TABLET | Freq: Three times a day (TID) | ORAL | Status: DC | PRN
  Filled 2017-01-15: qty 1

## 2017-01-15 MED ORDER — CIPROFLOXACIN IN D5W 400 MG/200ML IV SOLN
400.0000 mg | Freq: Once | INTRAVENOUS | Status: AC
  Administered 2017-01-15: 400 mg via INTRAVENOUS
  Filled 2017-01-15: qty 200

## 2017-01-15 MED ORDER — SODIUM CHLORIDE 0.9 % IV SOLN
INTRAVENOUS | Status: AC
  Administered 2017-01-15: 15:00:00 via INTRAVENOUS

## 2017-01-15 MED ORDER — ONDANSETRON HCL 4 MG/2ML IJ SOLN: 4.0000 mg | Freq: Four times a day (QID) | INTRAMUSCULAR | Status: DC | PRN

## 2017-01-15 MED ORDER — ONDANSETRON HCL 4 MG PO TABS: 4.0000 mg | ORAL_TABLET | Freq: Four times a day (QID) | ORAL | Status: DC | PRN

## 2017-01-15 MED ORDER — HYDROCODONE-ACETAMINOPHEN 5-325 MG PO TABS: 1.0000 | ORAL_TABLET | ORAL | Status: DC | PRN

## 2017-01-15 NOTE — Telephone Encounter (Signed)
Letter refaxed to request number.

## 2017-01-15 NOTE — Progress Notes (Signed)
This is a no-charge note.  For details, please see admission H&P from my partner Dr. Antionette Charpyd this morning.  In summary, 51 yo F with frequent watery diarrhea, mild crampy pain, recent outpatient clindamycin use.  Cdiff is PCR positive, weak toxin production.  On Flagyl by mouth this week.  Will add oral vanc and stop Cipro for now.

## 2017-01-15 NOTE — Telephone Encounter (Signed)
Patient called and they did not receive the letter, please resend to [phone]

## 2017-01-15 NOTE — Telephone Encounter (Signed)
Noted  

## 2017-01-15 NOTE — ED Notes (Signed)
Patient transported to X-ray 

## 2017-01-15 NOTE — Progress Notes (Signed)
Received report from [place], RN in ED.

## 2017-01-15 NOTE — ED Provider Notes (Signed)
[facility] EMERGENCY DEPARTMENT Provider Note   CSN: [id number] Arrival date & time: 01/14/17  2003     History   Chief Complaint Chief Complaint  Patient presents with  . Diarrhea    HPI Megan Medina is a 51 y.o. female.  Patient presents to the ED with a chief complaint of diarrhea.  She states that she has had diarrhea for over a week.  She reports having about 6-7 episodes daily.  She denies any fevers.  She reports abdominal cramps prior to having episodes of diarrhea, but otherwise denies abdominal pain.  She states that she has been taking flagyl as prescribed by her doctor.  Had a CT on 10/23 that showed pancolitis.  She states that she feels dehydrated and has not appetite and feels like she will vomit if she eats anything.  She denies seeing any blood in her stool.  She denies any other associated problems.   The history is provided by the patient. No language interpreter was used.    Past Medical History:  Diagnosis Date  . Allergic rhinitis   . Asthma   . IBS (irritable bowel syndrome)   . Pancolitis [facility])     Patient Active Problem List   Diagnosis Date Noted  . Coughing 06/16/2016  . Respiratory infection 05/30/2016  . Acute sinus infection 10/06/2013  . IBS (irritable bowel syndrome) 09/23/2010  . ALLERGIC RHINITIS 04/01/2007  . Asthma 04/01/2007  . ABNORMAL PAP SMEAR 04/01/2007    Past Surgical History:  Procedure Laterality Date  . TONSILLECTOMY      OB History    No data available       Home Medications    Prior to Admission medications   Medication Sig Start Date End Date Taking? Authorizing Provider  albuterol (PROVENTIL HFA;VENTOLIN HFA) 108 (90 Base) MCG/ACT inhaler Inhale 2 puffs into the lungs every 6 (six) hours as needed for wheezing or shortness of breath. 03/31/16   [place], Betty G, MD  azithromycin (ZITHROMAX Z-PAK) 250 MG tablet 2 tab by mouth day 1, then 1 per day Patient not taking: Reported on  01/11/2017 06/18/16   Corwin Levins, MD  budesonide-formoterol [facility]) 160-4.5 MCG/ACT inhaler Inhale 2 puffs into the lungs 2 (two) times daily. Patient not taking: Reported on 01/11/2017 11/28/15   Plotnikov, Georgina Quint, MD  ciprofloxacin (CIPRO) 500 MG tablet Take 1 tablet (500 mg total) by mouth 2 (two) times daily. 01/14/17   Nche, Bonna Gains, NP  clonazePAM (KLONOPIN) 0.5 MG tablet Take 1 tablet (0.5 mg total) by mouth 2 (two) times daily as needed (vertigo). 10/15/15 10/30/15  Sandford Craze, NP  escitalopram (LEXAPRO) 20 MG tablet Take 20 mg by mouth daily.    [provider]  fluticasone (FLONASE) 50 MCG/ACT nasal spray Place 2 sprays into both nostrils daily. 06/16/16   Myrlene Broker, MD  meclizine (ANTIVERT) 25 MG tablet Take 1 tablet (25 mg total) by mouth 3 (three) times daily as needed for dizziness. 10/15/15   Sandford Craze, NP  metroNIDAZOLE (FLAGYL) 500 MG tablet Take 1 tablet (500 mg total) by mouth 3 (three) times daily. 01/11/17   Nche, Bonna Gains, NP  montelukast (SINGULAIR) 10 MG tablet Take 1 tablet (10 mg total) by mouth at bedtime. 06/16/16   Myrlene Broker, MD  ondansetron (ZOFRAN ODT) 4 MG disintegrating tablet Take 1 tablet (4 mg total) by mouth every 8 (eight) hours as needed for nausea or vomiting. 01/11/17   Alysia Penna  Lum, NP  promethazine-dextromethorphan (PROMETHAZINE-DM) 6.25-15 MG/5ML syrup Take 5 mLs by mouth 4 (four) times daily as needed for cough. Patient not taking: Reported on 01/11/2017 06/16/16   Myrlene Broker, MD  valACYclovir (VALTREX) 500 MG tablet Take 1 tablet by mouth daily. 11/20/15   [provider]    Family History No family history on file.  Social History Social History  Substance Use Topics  . Smoking status: Never Smoker  . Smokeless tobacco: Never Used  . Alcohol use No     Allergies   Codeine phosphate; Hydrocodone; and Methylprednisolone   Review of Systems Review of  Systems  All other systems reviewed and are negative.    Physical Exam Updated Vital Signs BP 104/66 (BP Location: Right Arm)   Pulse (!) 110   Temp 98.2 F (36.8 C) (Oral)   Resp 18   Ht 5\' 3"  (1.6 m)   SpO2 98%   Physical Exam  Constitutional: She is oriented to person, place, and time. She appears well-developed and well-nourished.  HENT:  Head: Normocephalic and atraumatic.  Eyes: Pupils are equal, round, and reactive to light. Conjunctivae and EOM are normal.  Neck: Normal range of motion. Neck supple.  Cardiovascular: Normal rate and regular rhythm.  Exam reveals no gallop and no friction rub.   No murmur heard. Pulmonary/Chest: Effort normal and breath sounds normal. No respiratory distress. She has no wheezes. She has no rales. She exhibits no tenderness.  Abdominal: Soft. Bowel sounds are normal. She exhibits no distension and no mass. There is no tenderness. There is no rebound and no guarding.  No focal abdominal tenderness, no RLQ tenderness or pain at McBurney's point, no RUQ tenderness or Murphy's sign, no left-sided abdominal tenderness, no fluid wave, or signs of peritonitis   Musculoskeletal: Normal range of motion. She exhibits no edema or tenderness.  Neurological: She is alert and oriented to person, place, and time.  Skin: Skin is warm and dry.  Psychiatric: She has a normal mood and affect. Her behavior is normal. Judgment and thought content normal.  Nursing note and vitals reviewed.    ED Treatments / Results  Labs (all labs ordered are listed, but only abnormal results are displayed) Labs Reviewed  BASIC METABOLIC PANEL - Abnormal; Notable for the following:       Result Value   Sodium 133 (*)    Chloride 93 (*)    Glucose, Bld 113 (*)    Calcium 8.8 (*)    All other components within normal limits  CBC - Abnormal; Notable for the following:    WBC 27.4 (*)    RBC 5.22 (*)    Hemoglobin 15.5 (*)    All other components within normal limits    URINALYSIS, ROUTINE W REFLEX MICROSCOPIC - Abnormal; Notable for the following:    Color, Urine AMBER (*)    Bilirubin Urine SMALL (*)    Ketones, ur 20 (*)    Protein, ur 30 (*)    Leukocytes, UA SMALL (*)    All other components within normal limits    EKG  EKG Interpretation None       Radiology No results found.  Procedures Procedures (including critical care time)  Medications Ordered in ED Medications  sodium chloride 0.9 % bolus 1,000 mL (not administered)  ciprofloxacin (CIPRO) IVPB 400 mg (not administered)  metroNIDAZOLE (FLAGYL) IVPB 500 mg (not administered)     Initial Impression / Assessment and Plan / ED Course  I  have reviewed the triage vital signs and the nursing notes.  Pertinent labs & imaging results that were available during my care of the patient were reviewed by me and considered in my medical decision making (see chart for details).     Patient with diarrhea.  CT on 10/23 shows pancolitis.  Treated with flagyl with no relief.  No tolerating oral.  Will give fluids and IV abx.  May need admission for failing outpatient therapy.  Will reassess.  3:53 AM Patient is happy with plan for admission.  No free air on abdominal series.    Appreciate Dr. Antionette Charpyd for admitting.   Final Clinical Impressions(s) / ED Diagnoses   Final diagnoses:  Colitis    New Prescriptions New Prescriptions   No medications on file     Roxy HorsemanBrowning, Siddharth Babington, Cordelia Poche-C 01/15/17 [id number]    Palumbo, April, MD 01/15/17 [id number]

## 2017-01-15 NOTE — H&P (Addendum)
History and Physical    Megan Leatherwoodlizabeth P Carcione ZHY:[id number] DOB: 1965/09/18 DOA: 01/15/2017  PCP: Tresa GarterPlotnikov, Aleksei V, MD   Patient coming from: Home  Chief Complaint: Watery diarrhea, malaise, subjective fevers  HPI: Megan Medina is a 51 y.o. female with medical history significant for asthma, anxiety, and IBS, now presenting to the emergency department for evaluation of abdominal cramps, malaise, subjective fevers, and watery diarrhea.  The patient reports that the symptoms developed approximately 10 days ago.  She has abdominal discomfort described as a cramping.  Is been several episodes of watery diarrhea daily without blood, but with some mucus.  No vomiting.  Reports subjective fevers and chills, and also some lightheadedness.  She saw her PCP for evaluation of these complaints on 01/11/2017, had blood work revealing a leukocytosis to 14,000, and CT of the abdomen and pelvis that revealed a pancolitis.  She was prescribed ciprofloxacin and Flagyl at that time, but only filled the Flagyl.  She now reports that despite taking Flagyl at home, her symptoms have persisted with no improvement.  She had been on clindamycin in July for a sinus infection.  ED Course: Upon arrival to the ED, patient is found to be afebrile, saturating well on room air, tachycardic to 110, and with stable blood pressure.  EKG features a sinus rhythm with nonspecific ST-T wave abnormality in the inferior leads.  Chest x-ray is negative for acute cardiopulmonary disease and KUB features a nonobstructive bowel gas pattern and persistent colonic wall thickening consistent with her known colitis.  Chemistry panel is notable for a sodium of 133 and CBC features a leukocytosis to 27,400.  Patient was given a liter of normal saline, Zofran, ciprofloxacin, and Flagyl in the ED.  Tachycardia resolved, she remained hemodynamically stable and in no apparent respiratory distress, and will be admitted to the  medical/surgical unit for ongoing evaluation and management of colitis, suspected to be infectious.  Review of Systems:  All other systems reviewed and apart from HPI, are negative.  Past Medical History:  Diagnosis Date  . Allergic rhinitis   . Asthma   . IBS (irritable bowel syndrome)   . Pancolitis [facility](HCC)     Past Surgical History:  Procedure Laterality Date  . TONSILLECTOMY       reports that she has never smoked. She has never used smokeless tobacco. She reports that she does not drink alcohol or use drugs.  Allergies  Allergen Reactions  . Codeine Phosphate     REACTION: unspecified  . Hydrocodone Nausea And Vomiting  . Methylprednisolone     A local reaction to DepoMedrol    Family History  Problem Relation Age of Onset  . Diverticulosis Mother   . Inflammatory bowel disease Neg Hx      Prior to Admission medications   Medication Sig Start Date End Date Taking? Authorizing Provider  albuterol (PROVENTIL HFA;VENTOLIN HFA) 108 (90 Base) MCG/ACT inhaler Inhale 2 puffs into the lungs every 6 (six) hours as needed for wheezing or shortness of breath. 03/31/16   [place], Betty G, MD  budesonide-formoterol [facility](SYMBICORT) 160-4.5 MCG/ACT inhaler Inhale 2 puffs into the lungs 2 (two) times daily. Patient not taking: Reported on 01/11/2017 11/28/15   Plotnikov, Georgina QuintAleksei V, MD  ciprofloxacin (CIPRO) 500 MG tablet Take 1 tablet (500 mg total) by mouth 2 (two) times daily. 01/14/17   Nche, Bonna Gainsharlotte Lum, NP  clonazePAM (KLONOPIN) 0.5 MG tablet Take 1 tablet (0.5 mg total) by mouth 2 (two) times daily as needed (vertigo).  10/15/15 10/30/15  Sandford Craze, NP  escitalopram (LEXAPRO) 20 MG tablet Take 20 mg by mouth daily.    [provider]  fluticasone (FLONASE) 50 MCG/ACT nasal spray Place 2 sprays into both nostrils daily. 06/16/16   Myrlene Broker, MD  meclizine (ANTIVERT) 25 MG tablet Take 1 tablet (25 mg total) by mouth 3 (three) times daily as needed for dizziness.  10/15/15   Sandford Craze, NP  metroNIDAZOLE (FLAGYL) 500 MG tablet Take 1 tablet (500 mg total) by mouth 3 (three) times daily. 01/11/17   Nche, Bonna Gains, NP  montelukast (SINGULAIR) 10 MG tablet Take 1 tablet (10 mg total) by mouth at bedtime. 06/16/16   Myrlene Broker, MD  ondansetron (ZOFRAN ODT) 4 MG disintegrating tablet Take 1 tablet (4 mg total) by mouth every 8 (eight) hours as needed for nausea or vomiting. 01/11/17   Nche, Bonna Gains, NP  promethazine-dextromethorphan (PROMETHAZINE-DM) 6.25-15 MG/5ML syrup Take 5 mLs by mouth 4 (four) times daily as needed for cough. Patient not taking: Reported on 01/11/2017 06/16/16   Myrlene Broker, MD  valACYclovir (VALTREX) 500 MG tablet Take 1 tablet by mouth daily. 11/20/15   [provider]    Physical Exam: Vitals:   01/15/17 4098 01/15/17 0445 01/15/17 0557 01/15/17 0558  BP: 111/76 116/75 111/72   Pulse: 83 87  84  Resp:      Temp:      TempSrc:      SpO2: 95% 97%  98%  Height:          Constitutional: NAD, calm, appears uncomfortable Eyes: PERTLA, lids and conjunctivae normal ENMT: Mucous membranes are moist. Posterior pharynx clear of any exudate or lesions.   Neck: normal, supple, no masses, no thyromegaly Respiratory: clear to auscultation bilaterally, no wheezing, no crackles. Normal respiratory effort.   Cardiovascular: S1 & S2 heard, regular rate and rhythm. No extremity edema. No significant JVD. Abdomen: No distension, soft. Generalized tenderness without rebound pain or guarding. Bowel sounds normal.  Musculoskeletal: no clubbing / cyanosis. No joint deformity upper and lower extremities.   Skin: no significant rashes, lesions, ulcers. Poor turgor. Neurologic: CN 2-12 grossly intact. Sensation intact. Strength 5/5 in all 4 limbs.  Psychiatric: Alert and oriented x 3. Pleasant, cooperative.     Labs on Admission: I have personally reviewed following labs and imaging  studies  CBC:  Recent Labs Lab 01/11/17 1411 01/14/17 2044  WBC 14.3* 27.4*  NEUTROABS 10.5*  --   HGB 13.6 15.5*  HCT 40.1 45.0  MCV 87.1 86.2  PLT 327.0 399   Basic Metabolic Panel:  Recent Labs Lab 01/11/17 1411 01/14/17 2044  NA 137 133*  K 3.8 3.8  CL 99 93*  CO2 29 27  GLUCOSE 98 113*  BUN 14 8  CREATININE 0.82 0.94  CALCIUM 9.2 8.8*   GFR: Estimated Creatinine Clearance: 67.9 mL/min (by C-G formula based on SCr of 0.94 mg/dL). Liver Function Tests:  Recent Labs Lab 01/11/17 1411  AST 18  ALT 20  ALKPHOS 74  BILITOT 0.3  PROT 7.1  ALBUMIN 3.9    Recent Labs Lab 01/11/17 1411  LIPASE 9.0*  AMYLASE 23*   No results for input(s): AMMONIA in the last 168 hours. Coagulation Profile: No results for input(s): INR, PROTIME in the last 168 hours. Cardiac Enzymes: No results for input(s): CKTOTAL, CKMB, CKMBINDEX, TROPONINI in the last 168 hours. BNP (last 3 results) No results for input(s): PROBNP in the last 8760 hours. HbA1C:  No results for input(s): HGBA1C in the last 72 hours. CBG: No results for input(s): GLUCAP in the last 168 hours. Lipid Profile: No results for input(s): CHOL, HDL, LDLCALC, TRIG, CHOLHDL, LDLDIRECT in the last 72 hours. Thyroid Function Tests: No results for input(s): TSH, T4TOTAL, FREET4, T3FREE, THYROIDAB in the last 72 hours. Anemia Panel: No results for input(s): VITAMINB12, FOLATE, FERRITIN, TIBC, IRON, RETICCTPCT in the last 72 hours. Urine analysis:    Component Value Date/Time   COLORURINE AMBER (A) 01/14/2017 2048   APPEARANCEUR CLEAR 01/14/2017 2048   LABSPEC 1.028 01/14/2017 2048   PHURINE 5.0 01/14/2017 2048   GLUCOSEU NEGATIVE 01/14/2017 2048   GLUCOSEU NEGATIVE 09/23/2010 1445   HGBUR NEGATIVE 01/14/2017 2048   BILIRUBINUR SMALL (A) 01/14/2017 2048   KETONESUR 20 (A) 01/14/2017 2048   PROTEINUR 30 (A) 01/14/2017 2048   UROBILINOGEN 0.2 09/23/2010 1445   NITRITE NEGATIVE 01/14/2017 2048    LEUKOCYTESUR SMALL (A) 01/14/2017 2048   Sepsis Labs: @LABRCNTIP (procalcitonin:4,lacticidven:4) )No results found for this or any previous visit (from the past 240 hour(s)).   Radiological Exams on Admission: Dg Abd Acute W/chest  Result Date: 01/15/2017 CLINICAL DATA:  Dizziness and diarrhea for week. On antibiotics for colitis. EXAM: DG ABDOMEN ACUTE W/ 1V CHEST COMPARISON:  CT abdomen and pelvis January 12, 2017 FINDINGS: Cardiomediastinal silhouette is normal. Lungs are clear, no pleural effusions. No pneumothorax. Soft tissue planes and included osseous structures are unremarkable. Bowel gas pattern is nondilated and nonobstructive. Ascending colonic wall thickening with small amount of residual contrast. Overall paucity of bowel gas. No intra-abdominal mass effect, pathologic calcifications or free air. Soft tissue planes and included osseous structures are non-suspicious. IMPRESSION: Negative chest. Persistent colonic wall thickening consistent with known colitis. Nonobstructive bowel gas pattern. Electronically Signed   By: Awilda Metro M.D.   On: 01/15/2017 05:26    EKG: Independently reviewed. Sinus rhythm, non-specific ST & T abnormality in inferior leads.   Assessment/Plan  1. Colitis  - Pt saw PCP on 10/22 for 1 wk of cramping abdominal pain, watery diarrhea, malaise, and fevers; she had CT abd/pelvis with pancolitis and was prescribed Cipro and Flagyl but only filled the Flagyl   - She now presents with persistent sxs, no improvement despite Flagyl  - WBC has increased, renal fxn preserved, no fever here  - KUB with persistent colonic wall-thickening  - Treated with IVF, Cipro, and Flagyl in ED  - Plan to check GI pathogen panel and C diff, maintain enteric precautions pending results, continue Cipro and Flagyl, continue IVF hydration   2. Hyponatremia  - Serum sodium 133 in setting of hypovolemia  - Treated with 1 liter NS in ED and continued on NS infusion    3.  Anxiety  - Stable  - Continue Lexapro   4. Asthma, mild-intermittent  - No dyspnea, wheeze, or cough  - Continue albuterol prn    DVT prophylaxis: Lovenox Code Status: Full  Family Communication: Discussed with patient Disposition Plan: Admit to med-surg Consults called: None Admission status: Inpatient    Briscoe Deutscher, MD [facility] Pager [phone]  If 7PM-7AM, please contact night-coverage [email or web address] Password [email or web address]  01/15/2017, 6:13 AM

## 2017-01-15 NOTE — ED Notes (Signed)
Attempted to call report to [address] x 1. 

## 2017-01-15 NOTE — ED Notes (Signed)
Patient refused Lexapro. States "I take that at night right before bed - around 9 o'clock."

## 2017-01-16 DIAGNOSIS — F419 Anxiety disorder, unspecified: Secondary | ICD-10-CM

## 2017-01-16 DIAGNOSIS — E871 Hypo-osmolality and hyponatremia: Secondary | ICD-10-CM

## 2017-01-16 DIAGNOSIS — K5100 Ulcerative (chronic) pancolitis without complications: Secondary | ICD-10-CM

## 2017-01-16 DIAGNOSIS — J4520 Mild intermittent asthma, uncomplicated: Secondary | ICD-10-CM

## 2017-01-16 LAB — BASIC METABOLIC PANEL
Anion gap: 11 (ref 5–15)
BUN: 5 mg/dL — AB (ref 6–20)
CHLORIDE: 100 mmol/L — AB (ref 101–111)
CO2: 26 mmol/L (ref 22–32)
Calcium: 8.3 mg/dL — ABNORMAL LOW (ref 8.9–10.3)
Creatinine, Ser: 0.73 mg/dL (ref 0.44–1.00)
GFR calc Af Amer: >60 mL/min (ref >60)
GFR calc non Af Amer: >60 mL/min (ref >60)
Glucose, Bld: 90 mg/dL (ref 65–99)
POTASSIUM: 2.9 mmol/L — AB (ref 3.5–5.1)
SODIUM: 137 mmol/L (ref 135–145)

## 2017-01-16 LAB — CBC
HEMATOCRIT: 40.2 % (ref 36.0–46.0)
HEMOGLOBIN: 13.5 g/dL (ref 12.0–15.0)
MCH: 29 pg (ref 26.0–34.0)
MCHC: 33.6 g/dL (ref 30.0–36.0)
MCV: 86.5 fL (ref 78.0–100.0)
Platelets: 326 10*3/uL (ref 150–400)
RBC: 4.65 MIL/uL (ref 3.87–5.11)
RDW: 13.1 % (ref 11.5–15.5)
WBC: 17.8 10*3/uL — ABNORMAL HIGH (ref 4.0–10.5)

## 2017-01-16 LAB — MAGNESIUM: MAGNESIUM: 1.9 mg/dL (ref 1.7–2.4)

## 2017-01-16 MED ORDER — POTASSIUM CHLORIDE CRYS ER 20 MEQ PO TBCR
40.0000 meq | EXTENDED_RELEASE_TABLET | Freq: Two times a day (BID) | ORAL | Status: AC
  Administered 2017-01-16 (×2): 40 meq via ORAL
  Filled 2017-01-16 (×2): qty 2

## 2017-01-16 MED ORDER — POTASSIUM CHLORIDE IN NACL 40-0.9 MEQ/L-% IV SOLN
INTRAVENOUS | Status: DC
  Administered 2017-01-16 – 2017-01-17 (×2): 75 mL/h via INTRAVENOUS
  Filled 2017-01-16 (×3): qty 1000

## 2017-01-16 NOTE — Progress Notes (Signed)
PROGRESS NOTE    Megan Medina  ZOX:[id number] DOB: 04-18-1965 DOA: 01/15/2017 PCP: Tresa Garter, MD      Brief Narrative:  51 yo F who presents with 1 week frequent watery diarrhea, mild cramps, leukocytosis.  Had been on PPI and clindamycin in July by ENT.  GI pathogen panel negative, but Cdiff PCR antigen postiive and toxin weakly positive.  Started on vancomycin.   Assessment & Plan:  Principal Problem:   Colitis, acute Active Problems:   Asthma   Hyponatremia   Anxiety   Pancolitis (HCC)   C Diff colitis -Oral vancomycin -Advance diet as tolerated -IV fluids   Hyponatremia Resolved with fluids, from dehydration.  Hypokalemia From diarrhea. -Replace orally and IVF  Other medications -Continue Lexapro, antivert, Singulair, Valtex        DVT prophylaxis: Lovenox Code Status: FULL Family Communication: None present Disposition Plan: Still not taking much by mouth.  Patient is on oral antibiotics because this is the only therapeutic option available, but still having more output/stools than intake.  Will continue IV fluids likely one more day, with K supplement, then probably home tomorrow.   Consultants:   None  Procedures:   None  Antimicrobials:   Oral vancomycin 10/26 >>    Subjective: Feeling somewhat better.  A few stools overnight, still watery.  Cramps improving.  Appetite weak.  Abdominal pain better.  No fever, chils, hematochezia.  Objective: Vitals:   01/15/17 1400 01/15/17 1526 01/15/17 2129 01/16/17 0558  BP: 109/75 112/67 110/62 118/75  Pulse: 91 94 95 81  Resp:  16 18 18   Temp:  98.9 F (37.2 C) 98.4 F (36.9 C) 98.6 F (37 C)  TempSrc:  Oral Oral Oral  SpO2: 97% 100% 97% 96%  Weight:  76.6 kg (168 lb 14 oz)    Height:  5\' 6"  (1.676 m)      Intake/Output Summary (Last 24 hours) at 01/16/17 1308 Last data filed at 01/16/17 0859  Gross per 24 hour  Intake              240 ml  Output               100 ml  Net              140 ml   Filed Weights   01/15/17 1526  Weight: 76.6 kg (168 lb 14 oz)    Examination: General appearance: Adult adult female, alert and in no acute distress.   HEENT: Anicteric, conjunctiva pink, lids and lashes normal. No nasal deformity, discharge, epistaxis.  Lips moist.   Skin: Warm and dry.  No jaundice.  No suspicious rashes or lesions. Cardiac: RRR, nl S1-S2, no murmurs appreciated.  Capillary refill is brisk.  JVP normal.  No LE edema.  Radial pulses 2+ and symmetric. Respiratory: Normal respiratory rate and rhythm.  CTAB without rales or wheezes. Abdomen: Abdomen soft.  Minimal nonfocal TTP. No ascites, distension, hepatosplenomegaly.   MSK: No deformities or effusions. Neuro: Awake and alert.  EOMI, moves all extremities. Speech fluent.    Psych: Sensorium intact and responding to questions, attention normal. Affect normal.  Judgment and insight appear normal.    Data Reviewed: I have personally reviewed following labs and imaging studies:  CBC:  Recent Labs Lab 01/11/17 1411 01/14/17 2044 01/15/17 0717 01/16/17 0553  WBC 14.3* 27.4* 23.0* 17.8*  NEUTROABS 10.5*  --  20.4*  --   HGB 13.6 15.5* 13.6 13.5  HCT 40.1 45.0  39.7 40.2  MCV 87.1 86.2 85.7 86.5  PLT 327.0 399 330 326   Basic Metabolic Panel:  Recent Labs Lab 01/11/17 1411 01/14/17 2044 01/15/17 0717 01/16/17 0553  NA 137 133* 134* 137  K 3.8 3.8 3.1* 2.9*  CL 99 93* 100* 100*  CO2 29 27 22 26   GLUCOSE 98 113* 100* 90  BUN 14 8 7  5*  CREATININE 0.82 0.94 0.77 0.73  CALCIUM 9.2 8.8* 8.1* 8.3*   GFR: Estimated Creatinine Clearance: 85 mL/min (by C-G formula based on SCr of 0.73 mg/dL). Liver Function Tests:  Recent Labs Lab 01/11/17 1411 01/15/17 0717  AST 18 15  ALT 20 16  ALKPHOS 74 89  BILITOT 0.3 0.6  PROT 7.1 6.0*  ALBUMIN 3.9 2.8*    Recent Labs Lab 01/11/17 1411  LIPASE 9.0*  AMYLASE 23*   No results for input(s): AMMONIA in the last 168  hours. Coagulation Profile: No results for input(s): INR, PROTIME in the last 168 hours. Cardiac Enzymes: No results for input(s): CKTOTAL, CKMB, CKMBINDEX, TROPONINI in the last 168 hours. BNP (last 3 results) No results for input(s): PROBNP in the last 8760 hours. HbA1C: No results for input(s): HGBA1C in the last 72 hours. CBG: No results for input(s): GLUCAP in the last 168 hours. Lipid Profile: No results for input(s): CHOL, HDL, LDLCALC, TRIG, CHOLHDL, LDLDIRECT in the last 72 hours. Thyroid Function Tests: No results for input(s): TSH, T4TOTAL, FREET4, T3FREE, THYROIDAB in the last 72 hours. Anemia Panel: No results for input(s): VITAMINB12, FOLATE, FERRITIN, TIBC, IRON, RETICCTPCT in the last 72 hours. Urine analysis:    Component Value Date/Time   COLORURINE AMBER (A) 01/14/2017 2048   APPEARANCEUR CLEAR 01/14/2017 2048   LABSPEC 1.028 01/14/2017 2048   PHURINE 5.0 01/14/2017 2048   GLUCOSEU NEGATIVE 01/14/2017 2048   GLUCOSEU NEGATIVE 09/23/2010 1445   HGBUR NEGATIVE 01/14/2017 2048   BILIRUBINUR SMALL (A) 01/14/2017 2048   KETONESUR 20 (A) 01/14/2017 2048   PROTEINUR 30 (A) 01/14/2017 2048   UROBILINOGEN 0.2 09/23/2010 1445   NITRITE NEGATIVE 01/14/2017 2048   LEUKOCYTESUR SMALL (A) 01/14/2017 2048   Sepsis Labs: @LABRCNTIP (procalcitonin:4,lacticidven:4)  ) Recent Results (from the past 240 hour(s))  Gastrointestinal Panel by PCR , Stool     Status: None   Collection Time: 01/15/17  9:05 AM  Result Value Ref Range Status   Campylobacter species NOT DETECTED NOT DETECTED Final   Plesimonas shigelloides NOT DETECTED NOT DETECTED Final   Salmonella species NOT DETECTED NOT DETECTED Final   Yersinia enterocolitica NOT DETECTED NOT DETECTED Final   Vibrio species NOT DETECTED NOT DETECTED Final   Vibrio cholerae NOT DETECTED NOT DETECTED Final   Enteroaggregative E coli (EAEC) NOT DETECTED NOT DETECTED Final   Enteropathogenic E coli (EPEC) NOT DETECTED NOT  DETECTED Final   Enterotoxigenic E coli (ETEC) NOT DETECTED NOT DETECTED Final   Shiga like toxin producing E coli (STEC) NOT DETECTED NOT DETECTED Final   Shigella/Enteroinvasive E coli (EIEC) NOT DETECTED NOT DETECTED Final   Cryptosporidium NOT DETECTED NOT DETECTED Final   Cyclospora cayetanensis NOT DETECTED NOT DETECTED Final   Entamoeba histolytica NOT DETECTED NOT DETECTED Final   Giardia lamblia NOT DETECTED NOT DETECTED Final   Adenovirus F40/41 NOT DETECTED NOT DETECTED Final   Astrovirus NOT DETECTED NOT DETECTED Final   Norovirus GI/GII NOT DETECTED NOT DETECTED Final   Rotavirus A NOT DETECTED NOT DETECTED Final   Sapovirus (I, II, IV, and V) NOT DETECTED NOT DETECTED  Final  C difficile quick scan w PCR reflex     Status: Abnormal   Collection Time: 01/15/17  9:05 AM  Result Value Ref Range Status   C Diff antigen POSITIVE (A) NEGATIVE Final   C Diff toxin NEGATIVE NEGATIVE Final   C Diff interpretation Results are indeterminate. See PCR results.  Final  Clostridium Difficile by PCR     Status: Abnormal   Collection Time: 01/15/17  9:05 AM  Result Value Ref Range Status   Toxigenic C Difficile by pcr POSITIVE (A) NEGATIVE Final    Comment: Positive for toxigenic C. difficile with little to no toxin production. Only treat if clinical presentation suggests symptomatic illness.         Radiology Studies: Dg Abd Acute W/chest  Result Date: 01/15/2017 CLINICAL DATA:  Dizziness and diarrhea for week. On antibiotics for colitis. EXAM: DG ABDOMEN ACUTE W/ 1V CHEST COMPARISON:  CT abdomen and pelvis January 12, 2017 FINDINGS: Cardiomediastinal silhouette is normal. Lungs are clear, no pleural effusions. No pneumothorax. Soft tissue planes and included osseous structures are unremarkable. Bowel gas pattern is nondilated and nonobstructive. Ascending colonic wall thickening with small amount of residual contrast. Overall paucity of bowel gas. No intra-abdominal mass effect,  pathologic calcifications or free air. Soft tissue planes and included osseous structures are non-suspicious. IMPRESSION: Negative chest. Persistent colonic wall thickening consistent with known colitis. Nonobstructive bowel gas pattern. Electronically Signed   By: Awilda Metro M.D.   On: 01/15/2017 05:26        Scheduled Meds: . enoxaparin (LOVENOX) injection  40 mg Subcutaneous Q24H  . escitalopram  20 mg Oral Daily  . [START ON 01/17/2017] fluticasone  2 spray Each Nare Daily  . montelukast  10 mg Oral QHS  . valACYclovir  500 mg Oral Daily  . vancomycin  125 mg Oral QID   Continuous Infusions:   LOS: 1 day    Time spent: 25 minutes    Alberteen Sam, MD [facility] Pager [phone]  If 7PM-7AM, please contact night-coverage [email or web address] Password [email or web address] 01/16/2017, 1:08 PM

## 2017-01-17 LAB — BASIC METABOLIC PANEL
ANION GAP: 7 (ref 5–15)
BUN: 6 mg/dL (ref 6–20)
CALCIUM: 8.1 mg/dL — AB (ref 8.9–10.3)
CO2: 24 mmol/L (ref 22–32)
CREATININE: 0.65 mg/dL (ref 0.44–1.00)
Chloride: 106 mmol/L (ref 101–111)
GFR calc Af Amer: >60 mL/min (ref >60)
GLUCOSE: 100 mg/dL — AB (ref 65–99)
Potassium: 3.9 mmol/L (ref 3.5–5.1)
Sodium: 137 mmol/L (ref 135–145)

## 2017-01-17 MED ORDER — VANCOMYCIN 50 MG/ML ORAL SOLUTION: 125.0000 mg | Freq: Four times a day (QID) | ORAL | 0 refills | Status: AC

## 2017-01-17 MED ORDER — INFLUENZA VAC SPLIT QUAD 0.5 ML IM SUSY
0.5000 mL | PREFILLED_SYRINGE | INTRAMUSCULAR | Status: AC | PRN
  Administered 2017-01-17: 0.5 mL via INTRAMUSCULAR

## 2017-01-17 NOTE — Discharge Summary (Signed)
Physician Discharge Summary  Megan Medina:[id number] DOB: [birth year] DOA: 01/15/2017  PCP: Tresa Garter, MD  Admit date: 01/15/2017 Discharge date: 01/17/2017  Admitted From: Home Disposition:  Home  Recommendations for Outpatient Follow-up:  1. Follow up with PCP in 1-2 weeks   Home Health: No Equipment/Devices: No  Discharge Condition: Good  CODE STATUS: FULL Diet recommendation: Regular  Brief/Interim Summary: The patient is a 51 yo F prevuiously healthy who presents with 1 week watery frequent diarrhea, cramps and leukocytosis.  SHe had been prescribed clindamycin and omeprazole by her ENT in July.  GI pathogen PCR panel negative except for Cdiff which was antigen positive and weakly toxin producing by PCR.  She was started on oral vancomycin and IV fluids, potassium was corrected, and her stools became somewhat more solid in consistency and less frequent.  On the day of discharge she was feeling better and was discharged to complete a 10 day course.   Discharge Diagnoses:  Principal Problem:   Colitis, acute Active Problems:   Asthma   Hyponatremia   Anxiety   Pancolitis [name])    Discharge Instructions  Discharge Instructions    Diet general    Complete by:  As directed    Discharge instructions    Complete by:  As directed    From Dr. [name]: Take vancomycin solution 125 mg (2.5 mL) four times daily for 8 more days (three more doses on Sunday, last dose on Nov 5 Follow up with your PCP in 1 week Resume normal diet For preventing spread of CDIFF while taking vancomycin: wash hands regularly.   Increase activity slowly    Complete by:  As directed      Allergies as of 01/17/2017      Reactions   Codeine Phosphate    REACTION: unspecified   Hydrocodone Nausea And Vomiting   Methylprednisolone    A local reaction to DepoMedrol      Medication List    STOP taking these medications   ciprofloxacin 500 MG tablet Commonly known as:   CIPRO   metroNIDAZOLE 500 MG tablet Commonly known as:  FLAGYL   ondansetron 4 MG disintegrating tablet Commonly known as:  ZOFRAN ODT     TAKE these medications   albuterol 108 (90 Base) MCG/ACT inhaler Commonly known as:  PROVENTIL HFA;VENTOLIN HFA Inhale 2 puffs into the lungs every 6 (six) hours as needed for wheezing or shortness of breath.   budesonide-formoterol 160-4.5 MCG/ACT inhaler Commonly known as:  SYMBICORT Inhale 2 puffs into the lungs 2 (two) times daily. What changed:  when to take this  reasons to take this   escitalopram 20 MG tablet Commonly known as:  LEXAPRO Take 20 mg by mouth daily.   fluticasone 50 MCG/ACT nasal spray Commonly known as:  FLONASE Place 2 sprays into both nostrils daily. What changed:  when to take this  reasons to take this   meclizine 25 MG tablet Commonly known as:  ANTIVERT Take 1 tablet (25 mg total) by mouth 3 (three) times daily as needed for dizziness. What changed:  how much to take   valACYclovir 500 MG tablet Commonly known as:  VALTREX Take 1 tablet by mouth daily.   vancomycin 50 mg/mL oral solution Commonly known as:  VANCOCIN Take 2.5 mLs (125 mg total) by mouth 4 (four) times daily.       Allergies  Allergen Reactions  . Codeine Phosphate     REACTION: unspecified  . Hydrocodone Nausea And  Vomiting  . Methylprednisolone     A local reaction to DepoMedrol    Consultations:  None   Procedures/Studies: Ct Abdomen Pelvis W Contrast  Result Date: 01/12/2017 CLINICAL DATA:  51 year old female with left lower quadrant pain and diarrhea for 11 days. Leukocytosis. Initial encounter. EXAM: CT ABDOMEN AND PELVIS WITH CONTRAST TECHNIQUE: Multidetector CT imaging of the abdomen and pelvis was performed using the standard protocol following bolus administration of intravenous contrast. CONTRAST:  100mL ISOVUE-300 IOPAMIDOL (ISOVUE-300) INJECTION 61% COMPARISON:  None. FINDINGS: Lower chest: Lung bases  clear.  Heart size within normal limits. Hepatobiliary: Fatty infiltration of top-normal size liver without focal worrisome hepatic lesion. No calcified gallstones. Pancreas: No pancreatic mass or inflammation. Spleen: No splenic mass or enlargement. Adrenals/Urinary Tract: No obstructing stone or hydronephrosis. No worrisome renal or adrenal mass. Partially contracted urinary bladder with circumferential thickening. Stomach/Bowel: Diffuse colonic inflammation consistent with pancolitis most notable involving the descending colon and sigmoid colon. No gastric or small bowel abnormality. Vascular/Lymphatic: Increase number of normal to slightly enlarged mesenteric lymph nodes greater on the right. Trace aortic calcification. Reproductive: Post hysterectomy.  No adnexal mass identified. Other: No free air or drainable fluid collection. Musculoskeletal: No acute osseous abnormality. IMPRESSION: Pancolitis most notable involving the descending colon and sigmoid colon with surrounding reactive adenopathy. This may be infectious or inflammatory in origin. Ischemia felt to be a secondary less likely consideration. Fatty top-normal size liver. Trace aortic calcification. Post hysterectomy. These results will be called to the ordering clinician or representative by the Radiologist Assistant, and communication documented in the PACS or zVision Dashboard. Electronically Signed   By: Lacy DuverneySteven  Olson M.D.   On: 01/12/2017 15:31   Dg Abd Acute W/chest  Result Date: 01/15/2017 CLINICAL DATA:  Dizziness and diarrhea for week. On antibiotics for colitis. EXAM: DG ABDOMEN ACUTE W/ 1V CHEST COMPARISON:  CT abdomen and pelvis January 12, 2017 FINDINGS: Cardiomediastinal silhouette is normal. Lungs are clear, no pleural effusions. No pneumothorax. Soft tissue planes and included osseous structures are unremarkable. Bowel gas pattern is nondilated and nonobstructive. Ascending colonic wall thickening with small amount of residual  contrast. Overall paucity of bowel gas. No intra-abdominal mass effect, pathologic calcifications or free air. Soft tissue planes and included osseous structures are non-suspicious. IMPRESSION: Negative chest. Persistent colonic wall thickening consistent with known colitis. Nonobstructive bowel gas pattern. Electronically Signed   By: Awilda Metroourtnay  Bloomer M.D.   On: 01/15/2017 05:26   [place] Breast Ltd Uni Right Inc Axilla  Result Date: 12/29/2016 CLINICAL DATA:  51 year old female recalled from screening mammogram dated 12/21/2016 for a right breast mass. EXAM: 2D DIGITAL DIAGNOSTIC RIGHT MAMMOGRAM WITH CAD AND ADJUNCT TOMO ULTRASOUND RIGHT BREAST COMPARISON:  Previous exam(s). ACR Breast Density Category c: The breast tissue is heterogeneously dense, which may obscure small masses. FINDINGS: Previously described mass in the upper outer right breast at far posterior depth persists on today's additional views. It appears oval and circumscribed with gentle lobulations. Of note, the mass is inferior to the patient's previous biopsy site, and was not seen on mammograms dating back to 2011. Further evaluation with ultrasound was performed. Mammographic images were processed with CAD. Targeted ultrasound is performed, showing a circumscribed hypoechoic mass at the 10 o'clock position 6 cm from the nipple. It measures 0.8 x 0.7 x 0.4 cm with no internal vascularity. There is suggestion of a linear hyperechoic focus internally likely representing a post biopsy clip. This area is felt to correspond with the patient's previously biopsied mass,  and not with the finding on recent screening mammogram. Extensive sonographic evaluation of the entire lateral right breast demonstrates no other focal findings. IMPRESSION: 1. Indeterminate right breast mass without sonographic correlate for which stereotactic biopsy is recommended. 2. Benign right breast mass at the 10 o'clock position 6 cm from the nipple, previously biopsied and  consistent with fibrocystic changes. RECOMMENDATION: Stereotactic biopsy of the right breast. I have discussed the findings and recommendations with the patient. Results were also provided in writing at the conclusion of the visit. If applicable, a reminder letter will be sent to the patient regarding the next appointment. BI-RADS CATEGORY  4: Suspicious. Electronically Signed   By: Sande Brothers M.D.   On: 12/29/2016 09:58   Mm Diag Breast Tomo Uni Right  Result Date: 12/29/2016 CLINICAL DATA:  51 year old female recalled from screening mammogram dated 12/21/2016 for a right breast mass. EXAM: 2D DIGITAL DIAGNOSTIC RIGHT MAMMOGRAM WITH CAD AND ADJUNCT TOMO ULTRASOUND RIGHT BREAST COMPARISON:  Previous exam(s). ACR Breast Density Category c: The breast tissue is heterogeneously dense, which may obscure small masses. FINDINGS: Previously described mass in the upper outer right breast at far posterior depth persists on today's additional views. It appears oval and circumscribed with gentle lobulations. Of note, the mass is inferior to the patient's previous biopsy site, and was not seen on mammograms dating back to 2011. Further evaluation with ultrasound was performed. Mammographic images were processed with CAD. Targeted ultrasound is performed, showing a circumscribed hypoechoic mass at the 10 o'clock position 6 cm from the nipple. It measures 0.8 x 0.7 x 0.4 cm with no internal vascularity. There is suggestion of a linear hyperechoic focus internally likely representing a post biopsy clip. This area is felt to correspond with the patient's previously biopsied mass, and not with the finding on recent screening mammogram. Extensive sonographic evaluation of the entire lateral right breast demonstrates no other focal findings. IMPRESSION: 1. Indeterminate right breast mass without sonographic correlate for which stereotactic biopsy is recommended. 2. Benign right breast mass at the 10 o'clock position 6 cm from  the nipple, previously biopsied and consistent with fibrocystic changes. RECOMMENDATION: Stereotactic biopsy of the right breast. I have discussed the findings and recommendations with the patient. Results were also provided in writing at the conclusion of the visit. If applicable, a reminder letter will be sent to the patient regarding the next appointment. BI-RADS CATEGORY  4: Suspicious. Electronically Signed   By: Sande Brothers M.D.   On: 12/29/2016 09:58   Mm Clip Placement Right  Result Date: 12/31/2016 CLINICAL DATA:  Status post stereotactic guided core biopsy of mass in the upper-outer quadrant of the right breast. EXAM: DIAGNOSTIC RIGHT MAMMOGRAM POST STEREOTACTIC BIOPSY COMPARISON:  Previous exam(s). FINDINGS: Mammographic images were obtained following stereotactic guided biopsy of mass in the upper-outer quadrant of the right breast. A coil shaped clip is identified in the upper-outer quadrant following biopsy, within the expected location, caudal to the ribbon shaped clip placed the time of previous benign biopsy showing fibrocystic changes. IMPRESSION: Tissue marker clip in the expected location following biopsy. Final Assessment: Post Procedure Mammograms for Marker Placement Electronically Signed   By: Norva Pavlov M.D.   On: 12/31/2016 09:47   Mm Rt Breast Bx W Loc Dev 1st Lesion Image Bx Spec Stereo Guide  Addendum Date: 01/01/2017   ADDENDUM REPORT: 01/01/2017 14:16 ADDENDUM: Pathology revealed FIBROCYSTIC CHANGE of the Right breast, upper outer. This was found to be concordant by Dr. Norva Pavlov. Pathology  results were discussed with the patient by telephone. The patient reported doing well after the biopsy with tenderness at the site. Post biopsy instructions and care were reviewed and questions were answered. The patient was encouraged to call [facility] for any additional concerns. The patient was instructed to return for right diagnostic  mammography in 6 months and informed a reminder notice would be sent regarding this appointment. Pathology results reported by Rene Kocher, RN on 01/01/2017. Electronically Signed   By: Norva Pavlov M.D.   On: 01/01/2017 14:16   Result Date: 01/01/2017 CLINICAL DATA:  The patient presents for stereotactic guided core biopsy of mass in the upper-outer quadrant of the right breast. EXAM: RIGHT BREAST STEREOTACTIC CORE NEEDLE BIOPSY COMPARISON:  Previous exams. FINDINGS: The patient and I discussed the procedure of stereotactic-guided biopsy including benefits and alternatives. We discussed the high likelihood of a successful procedure. We discussed the risks of the procedure including infection, bleeding, tissue injury, clip migration, and inadequate sampling. Informed written consent was given. The usual time out protocol was performed immediately prior to the procedure. Using sterile technique and 1% Lidocaine as local anesthetic, under stereotactic guidance, a 9 gauge vacuum assisted device was used to perform core needle biopsy of mass within the upper-outer quadrant of the right breast using a superior to inferior approach. Lesion quadrant: Upper-outer quadrant right breast At the conclusion of the procedure, a coil shaped tissue marker clip was deployed into the biopsy cavity. Follow-up 2-view mammogram was performed and dictated separately. IMPRESSION: Stereotactic-guided biopsy of mass in the upper-outer quadrant of the right breast. No apparent complications. Electronically Signed: By: Norva Pavlov M.D. On: 12/31/2016 09:45       Subjective: Feeling well.  No appetite because everything tastes like vancomycin solution.  One stool overnight, semi-solid.  No cramps.  No dizziness or weakness.  Discharge Exam: Vitals:   01/17/17 0652 01/17/17 0700  BP: 132/62 128/74  Pulse: 78 74  Resp: 20 18  Temp: 99.2 F (37.3 C) 98.7 F (37.1 C)  SpO2: 98% 99%   Vitals:   01/16/17 1424  01/16/17 2203 01/17/17 0652 01/17/17 0700  BP: (!) 141/83 (!) 145/79 132/62 128/74  Pulse: 95 80 78 74  Resp: 18 18 20 18   Temp: 99 F (37.2 C) 98.2 F (36.8 C) 99.2 F (37.3 C) 98.7 F (37.1 C)  TempSrc: Oral Oral Oral Oral  SpO2: 99% 99% 98% 99%  Weight:    76.1 kg (167 lb 12.3 oz)  Height:    5\' 3"  (1.6 m)    General: Pt is alert, awake, not in acute distress Cardiovascular: RRR, S1/S2 +, no rubs, no gallops Respiratory: CTA bilaterally, no wheezing, no rhonchi Abdominal: Soft, NT, ND, bowel sounds + hyperactive Extremities: no edema, no cyanosis    The results of significant diagnostics from this hospitalization (including imaging, microbiology, ancillary and laboratory) are listed below for reference.     Microbiology: Recent Results (from the past 240 hour(s))  Gastrointestinal Panel by PCR , Stool     Status: None   Collection Time: 01/15/17  9:05 AM  Result Value Ref Range Status   Campylobacter species NOT DETECTED NOT DETECTED Final   Plesimonas shigelloides NOT DETECTED NOT DETECTED Final   Salmonella species NOT DETECTED NOT DETECTED Final   Yersinia enterocolitica NOT DETECTED NOT DETECTED Final   Vibrio species NOT DETECTED NOT DETECTED Final   Vibrio cholerae NOT DETECTED NOT DETECTED Final   Enteroaggregative E  coli (EAEC) NOT DETECTED NOT DETECTED Final   Enteropathogenic E coli (EPEC) NOT DETECTED NOT DETECTED Final   Enterotoxigenic E coli (ETEC) NOT DETECTED NOT DETECTED Final   Shiga like toxin producing E coli (STEC) NOT DETECTED NOT DETECTED Final   Shigella/Enteroinvasive E coli (EIEC) NOT DETECTED NOT DETECTED Final   Cryptosporidium NOT DETECTED NOT DETECTED Final   Cyclospora cayetanensis NOT DETECTED NOT DETECTED Final   Entamoeba histolytica NOT DETECTED NOT DETECTED Final   Giardia lamblia NOT DETECTED NOT DETECTED Final   Adenovirus F40/41 NOT DETECTED NOT DETECTED Final   Astrovirus NOT DETECTED NOT DETECTED Final   Norovirus GI/GII NOT  DETECTED NOT DETECTED Final   Rotavirus A NOT DETECTED NOT DETECTED Final   Sapovirus (I, II, IV, and V) NOT DETECTED NOT DETECTED Final  C difficile quick scan w PCR reflex     Status: Abnormal   Collection Time: 01/15/17  9:05 AM  Result Value Ref Range Status   C Diff antigen POSITIVE (A) NEGATIVE Final   C Diff toxin NEGATIVE NEGATIVE Final   C Diff interpretation Results are indeterminate. See PCR results.  Final  Clostridium Difficile by PCR     Status: Abnormal   Collection Time: 01/15/17  9:05 AM  Result Value Ref Range Status   Toxigenic C Difficile by pcr POSITIVE (A) NEGATIVE Final    Comment: Positive for toxigenic C. difficile with little to no toxin production. Only treat if clinical presentation suggests symptomatic illness.     Labs: BNP (last 3 results) No results for input(s): BNP in the last 8760 hours. Basic Metabolic Panel:  Recent Labs Lab 01/11/17 1411 01/14/17 2044 01/15/17 0717 01/16/17 0553 01/16/17 1315 01/17/17 0215  NA 137 133* 134* 137  --  137  K 3.8 3.8 3.1* 2.9*  --  3.9  CL 99 93* 100* 100*  --  106  CO2 29 27 22 26   --  24  GLUCOSE 98 113* 100* 90  --  100*  BUN 14 8 7  5*  --  6  CREATININE 0.82 0.94 0.77 0.73  --  0.65  CALCIUM 9.2 8.8* 8.1* 8.3*  --  8.1*  MG  --   --   --   --  1.9  --    Liver Function Tests:  Recent Labs Lab 01/11/17 1411 01/15/17 0717  AST 18 15  ALT 20 16  ALKPHOS 74 89  BILITOT 0.3 0.6  PROT 7.1 6.0*  ALBUMIN 3.9 2.8*    Recent Labs Lab 01/11/17 1411  LIPASE 9.0*  AMYLASE 23*   No results for input(s): AMMONIA in the last 168 hours. CBC:  Recent Labs Lab 01/11/17 1411 01/14/17 2044 01/15/17 0717 01/16/17 0553  WBC 14.3* 27.4* 23.0* 17.8*  NEUTROABS 10.5*  --  20.4*  --   HGB 13.6 15.5* 13.6 13.5  HCT 40.1 45.0 39.7 40.2  MCV 87.1 86.2 85.7 86.5  PLT 327.0 399 330 326   Cardiac Enzymes: No results for input(s): CKTOTAL, CKMB, CKMBINDEX, TROPONINI in the last 168 hours. BNP: Invalid  input(s): POCBNP CBG: No results for input(s): GLUCAP in the last 168 hours. D-Dimer No results for input(s): DDIMER in the last 72 hours. Hgb A1c No results for input(s): HGBA1C in the last 72 hours. Lipid Profile No results for input(s): CHOL, HDL, LDLCALC, TRIG, CHOLHDL, LDLDIRECT in the last 72 hours. Thyroid function studies No results for input(s): TSH, T4TOTAL, T3FREE, THYROIDAB in the last 72 hours.  Invalid input(s): FREET3  Anemia work up No results for input(s): VITAMINB12, FOLATE, FERRITIN, TIBC, IRON, RETICCTPCT in the last 72 hours. Urinalysis    Component Value Date/Time   COLORURINE AMBER (A) 01/14/2017 2048   APPEARANCEUR CLEAR 01/14/2017 2048   LABSPEC 1.028 01/14/2017 2048   PHURINE 5.0 01/14/2017 2048   GLUCOSEU NEGATIVE 01/14/2017 2048   GLUCOSEU NEGATIVE 09/23/2010 1445   HGBUR NEGATIVE 01/14/2017 2048   BILIRUBINUR SMALL (A) 01/14/2017 2048   KETONESUR 20 (A) 01/14/2017 2048   PROTEINUR 30 (A) 01/14/2017 2048   UROBILINOGEN 0.2 09/23/2010 1445   NITRITE NEGATIVE 01/14/2017 2048   LEUKOCYTESUR SMALL (A) 01/14/2017 2048   Sepsis Labs Invalid input(s): PROCALCITONIN,  WBC,  LACTICIDVEN Microbiology Recent Results (from the past 240 hour(s))  Gastrointestinal Panel by PCR , Stool     Status: None   Collection Time: 01/15/17  9:05 AM  Result Value Ref Range Status   Campylobacter species NOT DETECTED NOT DETECTED Final   Plesimonas shigelloides NOT DETECTED NOT DETECTED Final   Salmonella species NOT DETECTED NOT DETECTED Final   Yersinia enterocolitica NOT DETECTED NOT DETECTED Final   Vibrio species NOT DETECTED NOT DETECTED Final   Vibrio cholerae NOT DETECTED NOT DETECTED Final   Enteroaggregative E coli (EAEC) NOT DETECTED NOT DETECTED Final   Enteropathogenic E coli (EPEC) NOT DETECTED NOT DETECTED Final   Enterotoxigenic E coli (ETEC) NOT DETECTED NOT DETECTED Final   Shiga like toxin producing E coli (STEC) NOT DETECTED NOT DETECTED Final    Shigella/Enteroinvasive E coli (EIEC) NOT DETECTED NOT DETECTED Final   Cryptosporidium NOT DETECTED NOT DETECTED Final   Cyclospora cayetanensis NOT DETECTED NOT DETECTED Final   Entamoeba histolytica NOT DETECTED NOT DETECTED Final   Giardia lamblia NOT DETECTED NOT DETECTED Final   Adenovirus F40/41 NOT DETECTED NOT DETECTED Final   Astrovirus NOT DETECTED NOT DETECTED Final   Norovirus GI/GII NOT DETECTED NOT DETECTED Final   Rotavirus A NOT DETECTED NOT DETECTED Final   Sapovirus (I, II, IV, and V) NOT DETECTED NOT DETECTED Final  C difficile quick scan w PCR reflex     Status: Abnormal   Collection Time: 01/15/17  9:05 AM  Result Value Ref Range Status   C Diff antigen POSITIVE (A) NEGATIVE Final   C Diff toxin NEGATIVE NEGATIVE Final   C Diff interpretation Results are indeterminate. See PCR results.  Final  Clostridium Difficile by PCR     Status: Abnormal   Collection Time: 01/15/17  9:05 AM  Result Value Ref Range Status   Toxigenic C Difficile by pcr POSITIVE (A) NEGATIVE Final    Comment: Positive for toxigenic C. difficile with little to no toxin production. Only treat if clinical presentation suggests symptomatic illness.     Time coordinating discharge: Over 30 minutes  SIGNED:   Alberteen Sam, MD  [facility] 01/17/2017, 3:50 PM   If 7PM-7AM, please contact night-coverage [email or web address] Password [email or web address]

## 2017-01-17 NOTE — Progress Notes (Signed)
Discharge instructions gone over with patient.  Home medications discussed. Prescription given. Specific instructions regarding hand washing gone over. Signs and symptoms of infection discussed. Patient verbalized understanding of instructions. Patient also received note for work.

## 2017-01-20 ENCOUNTER — Telehealth: Payer: Self-pay | Admitting: Internal Medicine

## 2017-01-20 NOTE — Telephone Encounter (Signed)
Is requesting that if patient comes into the office that patient is asked to contact Clydie BraunKaren

## 2017-02-08 ENCOUNTER — Ambulatory Visit (INDEPENDENT_AMBULATORY_CARE_PROVIDER_SITE_OTHER): Payer: Managed Care, Other (non HMO) | Admitting: Internal Medicine

## 2017-02-08 ENCOUNTER — Encounter: Payer: Self-pay | Admitting: Internal Medicine

## 2017-02-08 VITALS — BP 116/68 | HR 96 | Temp 98.6°F | Resp 14 | Ht 63.0 in | Wt 163.1 lb

## 2017-02-08 DIAGNOSIS — J069 Acute upper respiratory infection, unspecified: Secondary | ICD-10-CM

## 2017-02-08 DIAGNOSIS — J45901 Unspecified asthma with (acute) exacerbation: Secondary | ICD-10-CM

## 2017-02-08 MED ORDER — AZELASTINE HCL 0.1 % NA SOLN: 2.0000 | Freq: Every evening | NASAL | 3 refills | Status: DC | PRN

## 2017-02-08 MED ORDER — ALBUTEROL SULFATE HFA 108 (90 BASE) MCG/ACT IN AERS: 2.0000 | INHALATION_SPRAY | Freq: Four times a day (QID) | RESPIRATORY_TRACT | 2 refills | Status: DC | PRN

## 2017-02-08 NOTE — Progress Notes (Signed)
Subjective:    Patient ID: Megan Medina, female    DOB: 21-Feb-1966, 51 y.o.   MRN: [id number]  DOS:  02/08/2017 Type of visit - description : acute Interval history: Symptoms that started 3 days ago with sinus congestion, cough, chest congestion. Mild amount of yellow nasal discharge and sputum. Had a temperature of 100.4 yesterday.  On chart review: Admitted to the hospital with colitis and discharged 01/17/2017, PCR panel was positive for C Diff with weakly toxin positive. She was started on oral vancomycin.  Review of Systems Currently with no nausea, vomiting, diarrhea. No unusual aches or pains Minimal if any wheezing  Past Medical History:  Diagnosis Date  . Allergic rhinitis   . Asthma   . IBS (irritable bowel syndrome)   . Pancolitis [facility](HCC)     Past Surgical History:  Procedure Laterality Date  . PARTIAL HYSTERECTOMY     vaginal  . TONSILLECTOMY      Social History   Socioeconomic History  . Marital status: Married    Spouse name: Not on file  . Number of children: Not on file  . Years of education: Not on file  . Highest education level: Not on file  Social Needs  . Financial resource strain: Not on file  . Food insecurity - worry: Not on file  . Food insecurity - inability: Not on file  . Transportation needs - medical: Not on file  . Transportation needs - non-medical: Not on file  Occupational History  . Not on file  Tobacco Use  . Smoking status: Never Smoker  . Smokeless tobacco: Never Used  Substance and Sexual Activity  . Alcohol use: No  . Drug use: No  . Sexual activity: Yes  Other Topics Concern  . Not on file  Social History Narrative   Regular exercise-yes.      Allergies as of 02/08/2017      Reactions   Codeine Phosphate    REACTION: unspecified   Hydrocodone Nausea And Vomiting   Methylprednisolone    A local reaction to DepoMedrol      Medication List        Accurate as of 02/08/17 11:59 PM. Always use  your most recent med list.          albuterol 108 (90 Base) MCG/ACT inhaler Commonly known as:  PROVENTIL HFA;VENTOLIN HFA Inhale 2 puffs every 6 (six) hours as needed into the lungs for wheezing or shortness of breath.   azelastine 0.1 % nasal spray Commonly known as:  ASTELIN Place 2 sprays at bedtime as needed into both nostrils for rhinitis. Use in each nostril as directed   escitalopram 20 MG tablet Commonly known as:  LEXAPRO Take 20 mg by mouth daily.   fluticasone 50 MCG/ACT nasal spray Commonly known as:  FLONASE Place 2 sprays into both nostrils daily.   meclizine 25 MG tablet Commonly known as:  ANTIVERT Take 1 tablet (25 mg total) by mouth 3 (three) times daily as needed for dizziness.   valACYclovir 500 MG tablet Commonly known as:  VALTREX Take 1 tablet by mouth daily.          Objective:   Physical Exam BP 116/68 (BP Location: Left Arm, Patient Position: Sitting, Cuff Size: Small)   Pulse 96   Temp 98.6 F (37 C) (Oral)   Resp 14   Ht 5\' 3"  (1.6 m)   Wt 163 lb 2 oz (74 kg)   SpO2 94%   BMI 28.90  kg/m  General:   Well developed, well nourished . NAD.  HEENT:  Normocephalic . Face symmetric, atraumatic.  TMs bulging, throat symmetric, normal rate. Lungs:  Rhonchi with cough otherwise clear Normal respiratory effort, no intercostal retractions, no accessory muscle use. Heart: RRR,  no murmur.  No pretibial edema bilaterally  Skin: Not pale. Not jaundice Neurologic:  alert & oriented X3.  Speech normal, gait appropriate for age and unassisted Psych--  Cognition and judgment appear intact.  Cooperative with normal attention span and concentration.  Behavior appropriate. No anxious or depressed appearing.      Assessment & Plan:   51 year old lady with history of anxiety,  Asthma,h/o  pancolitis, presents with  URI: Sx c/w URI, will treat conservatively with Mucinex, Flonase, Astelin.  See AVS.  Refill albuterol, encouraged her to use  albuterol if  persistent cough. W/ h/o C. difficile colitis, will not prescribe antibiotics unless is strictly necessary, patient aware.

## 2017-02-08 NOTE — Progress Notes (Signed)
Pre visit review using our clinic review tool, if applicable. No additional management support is needed unless otherwise documented below in the visit note. 

## 2017-02-08 NOTE — Patient Instructions (Addendum)
Rest, fluids , tylenol  For cough:  Take Mucinex DM twice a day as needed until better  For nasal congestion: Use OTC Nasocort or Flonase : 2 nasal sprays on each side of the nose in the morning until you feel better Use ASTELIN a prescribed spray : 2 nasal sprays on each side of the nose at night until you feel better    Call if not gradually better over the next  10 days  Call anytime if the symptoms are severe

## 2017-02-24 NOTE — Telephone Encounter (Signed)
error 

## 2017-06-03 ENCOUNTER — Other Ambulatory Visit: Payer: Self-pay | Admitting: Obstetrics and Gynecology

## 2017-06-03 DIAGNOSIS — N6310 Unspecified lump in the right breast, unspecified quadrant: Secondary | ICD-10-CM

## 2017-07-05 ENCOUNTER — Ambulatory Visit
Admission: RE | Admit: 2017-07-05 | Discharge: 2017-07-05 | Disposition: A | Discharge status: Home / Self Care | Payer: Managed Care, Other (non HMO) | Source: Ambulatory Visit | Attending: Obstetrics and Gynecology | Admitting: Obstetrics and Gynecology

## 2017-07-05 DIAGNOSIS — N6310 Unspecified lump in the right breast, unspecified quadrant: Secondary | ICD-10-CM

## 2017-07-16 ENCOUNTER — Telehealth: Payer: Self-pay | Admitting: Internal Medicine

## 2017-07-16 NOTE — Telephone Encounter (Signed)
Copied from CRM [phone] [id number]#[id number]. Topic: Quick Communication - See Telephone Encounter >> Jul 16, 2017  3:27 PM Windy KalataMichael, Alekxander Isola L, NT wrote: CRM for notification. See Telephone encounter for: 07/16/17.  Patient is calling and states she leaves on 07/14/17 to go on a 14 day cruise and states she get motion sickness. She would like zofran and meclizine called in.   [id number] [place], [place] - [address] [place] [address] Phone: [phone] Fax: [phone]

## 2017-07-19 NOTE — Telephone Encounter (Signed)
PT called to check on status

## 2017-07-20 MED ORDER — ONDANSETRON HCL 4 MG PO TABS: 4.0000 mg | ORAL_TABLET | Freq: Three times a day (TID) | ORAL | 1 refills | Status: DC | PRN

## 2017-07-20 MED ORDER — MECLIZINE HCL 25 MG PO TABS: 25.0000 mg | ORAL_TABLET | Freq: Three times a day (TID) | ORAL | 1 refills | Status: AC | PRN

## 2017-07-20 NOTE — Telephone Encounter (Signed)
Patient has called back in stating she is leaving tomorrow and would like medication.  Please call patient back today in regard.

## 2017-07-20 NOTE — Telephone Encounter (Signed)
Ok done Thx 

## 2017-07-20 NOTE — Telephone Encounter (Signed)
Patient notified

## 2018-02-09 ENCOUNTER — Encounter: Payer: Self-pay | Admitting: Family

## 2018-02-09 ENCOUNTER — Ambulatory Visit (INDEPENDENT_AMBULATORY_CARE_PROVIDER_SITE_OTHER): Payer: 59 | Admitting: Family

## 2018-02-09 VITALS — BP 114/78 | HR 94 | Temp 98.4°F | Ht 63.0 in | Wt 173.0 lb

## 2018-02-09 DIAGNOSIS — J019 Acute sinusitis, unspecified: Secondary | ICD-10-CM

## 2018-02-09 MED ORDER — AMOXICILLIN-POT CLAVULANATE 875-125 MG PO TABS: 1.0000 | ORAL_TABLET | Freq: Two times a day (BID) | ORAL | 0 refills | Status: DC

## 2018-02-09 NOTE — Progress Notes (Signed)
Megan Medina is a 52 y.o. female with the following history as recorded in EpicCare:  Patient Active Problem List   Diagnosis Date Noted  . Colitis, acute 01/15/2017  . Hyponatremia 01/15/2017  . Anxiety 01/15/2017  . Pancolitis (HCC) 01/15/2017  . Coughing 06/16/2016  . Respiratory infection 05/30/2016  . Acute sinus infection 10/06/2013  . IBS (irritable bowel syndrome) 09/23/2010  . ALLERGIC RHINITIS 04/01/2007  . Asthma 04/01/2007  . ABNORMAL PAP SMEAR 04/01/2007    Current Outpatient Medications  Medication Sig Dispense Refill  . albuterol (PROVENTIL HFA;VENTOLIN HFA) 108 (90 Base) MCG/ACT inhaler Inhale 2 puffs every 6 (six) hours as needed into the lungs for wheezing or shortness of breath. 1 Inhaler 2  . escitalopram (LEXAPRO) 20 MG tablet Take 20 mg by mouth daily.    . meclizine (ANTIVERT) 25 MG tablet Take 1 tablet (25 mg total) by mouth 3 (three) times daily as needed for dizziness. 30 tablet 1  . ondansetron (ZOFRAN) 4 MG tablet Take 1 tablet (4 mg total) by mouth every 8 (eight) hours as needed for nausea or vomiting. 20 tablet 1  . valACYclovir (VALTREX) 500 MG tablet Take 1 tablet by mouth daily.    Marland Kitchen. amoxicillin-clavulanate (AUGMENTIN) 875-125 MG tablet Take 1 tablet by mouth 2 (two) times daily. 20 tablet 0  . amoxicillin-clavulanate (AUGMENTIN) 875-125 MG tablet Take 1 tablet by mouth 2 (two) times daily. 20 tablet 0  . azelastine (ASTELIN) 0.1 % nasal spray Place 2 sprays at bedtime as needed into both nostrils for rhinitis. Use in each nostril as directed (Patient not taking: Reported on 02/09/2018) 30 mL 3  . fluticasone (FLONASE) 50 MCG/ACT nasal spray Place 2 sprays into both nostrils daily. (Patient not taking: Reported on 02/09/2018) 16 g 6   No current facility-administered medications for this visit.     Allergies: Clindamycin/lincomycin; Codeine phosphate; Hydrocodone; and Methylprednisolone  Past Medical History:  Diagnosis Date  . Allergic  rhinitis   . Asthma   . IBS (irritable bowel syndrome)   . Pancolitis [facility](HCC)     Past Surgical History:  Procedure Laterality Date  . PARTIAL HYSTERECTOMY     vaginal  . TONSILLECTOMY      Family History  Problem Relation Age of Onset  . Diverticulosis Mother   . Inflammatory bowel disease Neg Hx     Social History   Tobacco Use  . Smoking status: Never Smoker  . Smokeless tobacco: Never Used  Substance Use Topics  . Alcohol use: No    Subjective:  Patient presents with concerns for cough/ congestion x 1 week; + sinus pain, pressure; no chest pain, no problem breathing; + low-grade fever; prone to recurrent sinus infections; has Rx for Flonase and Astelin- not using regularly;    Objective:  Vitals:   02/09/18 1349  BP: 114/78  Pulse: 94  Temp: 98.4 F (36.9 C)  TempSrc: Oral  SpO2: 95%  Weight: 173 lb 0.6 oz (78.5 kg)  Height: 5\' 3"  (1.6 m)    General: Well developed, well nourished, in no acute distress  Skin : Warm and dry.  Head: Normocephalic and atraumatic  Eyes: Sclera and conjunctiva clear; pupils round and reactive to light; extraocular movements intact  Ears: External normal; canals clear; tympanic membranes erythematous bilaterally Oropharynx: Pink, supple. No suspicious lesions  Neck: Supple without thyromegaly, adenopathy  Lungs: Respirations unlabored; clear to auscultation bilaterally without wheeze, rales, rhonchi  CVS exam: normal rate and regular rhythm.  Neurologic: Alert and  oriented; speech intact; face symmetrical; moves all extremities well; CNII-XII intact without focal deficit   Assessment:  1. Acute sinusitis, recurrence not specified, unspecified location     Plan:  Rx for Augmentin 875 mg bid x 10 days; encouraged to start using Flonase and Astelin for symptom relief/ try to prevent future infections; increase fluids, rest and follow-up worse, no better.   No follow-ups on file.  No orders of the defined types were placed in this  encounter.   Requested Prescriptions   Signed Prescriptions Disp Refills  . amoxicillin-clavulanate (AUGMENTIN) 875-125 MG tablet 20 tablet 0    Sig: Take 1 tablet by mouth 2 (two) times daily.  Marland Kitchen amoxicillin-clavulanate (AUGMENTIN) 875-125 MG tablet 20 tablet 0    Sig: Take 1 tablet by mouth 2 (two) times daily.

## 2018-03-07 ENCOUNTER — Telehealth: Payer: Self-pay | Admitting: Internal Medicine

## 2018-03-07 DIAGNOSIS — J45901 Unspecified asthma with (acute) exacerbation: Secondary | ICD-10-CM

## 2018-03-08 NOTE — Telephone Encounter (Signed)
Pt called stating pharmacy told her RX was denied. Per RX it was sent to "print". Please resend via escribe.  [id number] [place], [place] - [address]  [address] [place] [address]  Phone: [phone] Fax: [phone]    Pt was advised appt needed and declined to schedule at this time. She stated when she is sick she cannot see her provider.

## 2018-03-08 NOTE — Telephone Encounter (Signed)
Patient called in and stated she would like script sent to   [facility] [address] - [place], [place] - [address] [phone] (Phone) [phone] (Fax)

## 2018-03-09 ENCOUNTER — Ambulatory Visit: Payer: Self-pay | Admitting: *Deleted

## 2018-03-09 DIAGNOSIS — J45901 Unspecified asthma with (acute) exacerbation: Secondary | ICD-10-CM

## 2018-03-09 MED ORDER — ALBUTEROL SULFATE HFA 108 (90 BASE) MCG/ACT IN AERS
INHALATION_SPRAY | RESPIRATORY_TRACT | 0 refills | Status: DC
Start: 2018-03-09 — End: 2018-03-10

## 2018-03-09 NOTE — Telephone Encounter (Signed)
Requested Prescriptions  Pending Prescriptions Disp Refills  . albuterol (VENTOLIN HFA) 108 (90 Base) MCG/ACT inhaler 18 Inhaler 0    Sig: INHALE 2 PUFFS EVERY 6 (SIX) HOURS AS NEEDED INTO THE LUNGS FOR WHEEZING OR SHORTNESS OF BREATH     Pulmonology:  Beta Agonists Failed - 03/09/2018  3:25 PM      Failed - One inhaler should last at least one month. If the patient is requesting refills earlier, contact the patient to check for uncontrolled symptoms.      Passed - Valid encounter within last 12 months    Recent Outpatient Visits          4 weeks ago Acute sinusitis, recurrence not specified, unspecified location   [facility] -[facility] Murray, Allyne GeeLaura Woodruff, FNP   1 year ago Viral upper respiratory tract infection   [facility], Nolon RodJose E, MD   1 year ago Diarrhea, unspecified type   [facility] -[facility] N[facility], Bonna Gainsharlotte Lum, NP   1 year ago Coughing   [facility] [place] Crawford, Edris A, MD   1 year ago Respiratory infection   [facility], [place], [place]

## 2018-03-09 NOTE — Telephone Encounter (Signed)
The pt states that she needs a refill on her ventolin; [facility] [address] and was told that it was [facility]; the pt called the office 03/08/18 and was told the prescription was approved but if she needed additional refills she would need an office visit; the pt then says that she [facility] but they have not received it 12/178/19; now the pt would like for the [facility] [address]; the pt says that she is not having shortness of breath or difficulty breathing; she uses the inhaler because she has coughing spells and the inhaler helps with this.  Reason for Disposition . Caller requesting a refill, no triage required, and triager able to refill per unit policy  Answer Assessment - Initial Assessment Questions 1. SYMPTOMS: "Do you have any symptoms?"     no 2. SEVERITY: If symptoms are present, ask "Are they mild, moderate or severe?"     n/a  Protocols used: MEDICATION QUESTION CALL-A-AH

## 2018-03-10 ENCOUNTER — Telehealth: Payer: Self-pay | Admitting: Internal Medicine

## 2018-03-10 DIAGNOSIS — J45901 Unspecified asthma with (acute) exacerbation: Secondary | ICD-10-CM

## 2018-03-10 MED ORDER — ALBUTEROL SULFATE HFA 108 (90 BASE) MCG/ACT IN AERS: INHALATION_SPRAY | RESPIRATORY_TRACT | 0 refills | Status: DC

## 2018-03-10 NOTE — Telephone Encounter (Signed)
Patient's albuterol (VENTOLIN HFA) 108 (90 Base) MCG/ACT inhaler was printed instead of sent electronically. Can this be sent in as soon as possible please? Thank you!

## 2018-03-10 NOTE — Telephone Encounter (Signed)
RX sent

## 2018-04-07 DIAGNOSIS — Z1231 Encounter for screening mammogram for malignant neoplasm of breast: Secondary | ICD-10-CM | POA: Diagnosis not present

## 2018-05-03 DIAGNOSIS — Z01411 Encounter for gynecological examination (general) (routine) with abnormal findings: Secondary | ICD-10-CM | POA: Diagnosis not present

## 2018-05-03 DIAGNOSIS — K219 Gastro-esophageal reflux disease without esophagitis: Secondary | ICD-10-CM | POA: Diagnosis not present

## 2018-05-03 DIAGNOSIS — Z6829 Body mass index (BMI) 29.0-29.9, adult: Secondary | ICD-10-CM | POA: Diagnosis not present

## 2018-05-03 DIAGNOSIS — Z01419 Encounter for gynecological examination (general) (routine) without abnormal findings: Secondary | ICD-10-CM | POA: Diagnosis not present

## 2018-05-23 DIAGNOSIS — Z13228 Encounter for screening for other metabolic disorders: Secondary | ICD-10-CM | POA: Diagnosis not present

## 2018-05-23 DIAGNOSIS — Z13220 Encounter for screening for lipoid disorders: Secondary | ICD-10-CM | POA: Diagnosis not present

## 2018-05-23 DIAGNOSIS — Z1329 Encounter for screening for other suspected endocrine disorder: Secondary | ICD-10-CM | POA: Diagnosis not present

## 2018-05-23 DIAGNOSIS — Z8249 Family history of ischemic heart disease and other diseases of the circulatory system: Secondary | ICD-10-CM | POA: Diagnosis not present

## 2018-05-23 DIAGNOSIS — Z1321 Encounter for screening for nutritional disorder: Secondary | ICD-10-CM | POA: Diagnosis not present

## 2018-07-26 ENCOUNTER — Telehealth: Payer: Self-pay | Admitting: Internal Medicine

## 2018-07-26 DIAGNOSIS — J45901 Unspecified asthma with (acute) exacerbation: Secondary | ICD-10-CM

## 2018-07-26 NOTE — Telephone Encounter (Signed)
Copied from CRM [id number]. Topic: Quick Communication - Rx Refill/Question >> Jul 26, 2018 12:55 PM Angela Nevin wrote: Medication: albuterol (VENTOLIN HFA) 108 (90 Base) MCG/ACT inhaler   Patient is requesting a refill of this medication    Preferred Pharmacy (with phone number or street name):[id number] [address], [place] - [address] [phone] (Phone) [phone] (Fax)

## 2018-07-27 MED ORDER — ALBUTEROL SULFATE HFA 108 (90 BASE) MCG/ACT IN AERS: 2.0000 | INHALATION_SPRAY | Freq: Four times a day (QID) | RESPIRATORY_TRACT | 0 refills | Status: DC | PRN

## 2018-07-27 NOTE — Telephone Encounter (Signed)
RX sent Patient needs office visit before refills will be given  

## 2018-08-16 ENCOUNTER — Other Ambulatory Visit: Payer: Self-pay | Admitting: Internal Medicine

## 2018-08-16 DIAGNOSIS — J45901 Unspecified asthma with (acute) exacerbation: Secondary | ICD-10-CM

## 2018-12-12 IMAGING — MG MM CLIP PLACEMENT
6 series · 6 of 14 positions shown · non-contrast
Comparison: Previous exam(s).

CLINICAL DATA: Status post stereotactic guided core biopsy of mass
in the upper-outer quadrant of the right breast.

EXAM:
DIAGNOSTIC RIGHT MAMMOGRAM POST STEREOTACTIC BIOPSY

[R ML synth-2D]
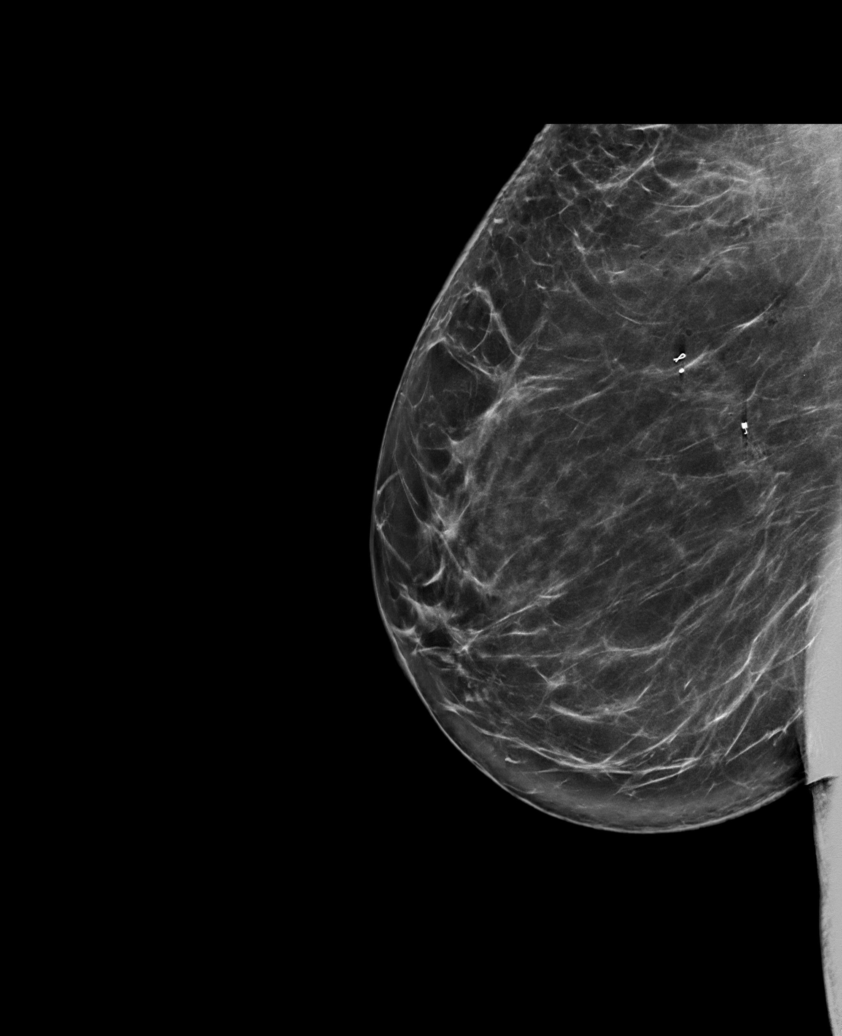

[R ML]
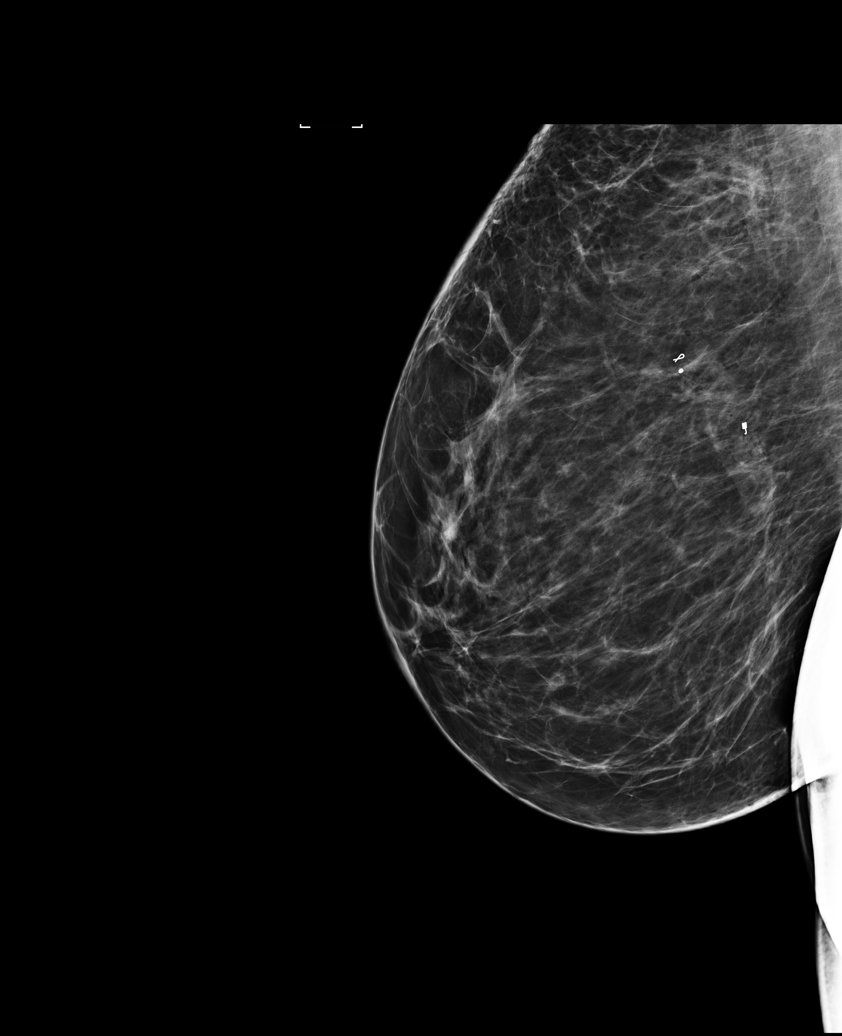

[R CC synth-2D]
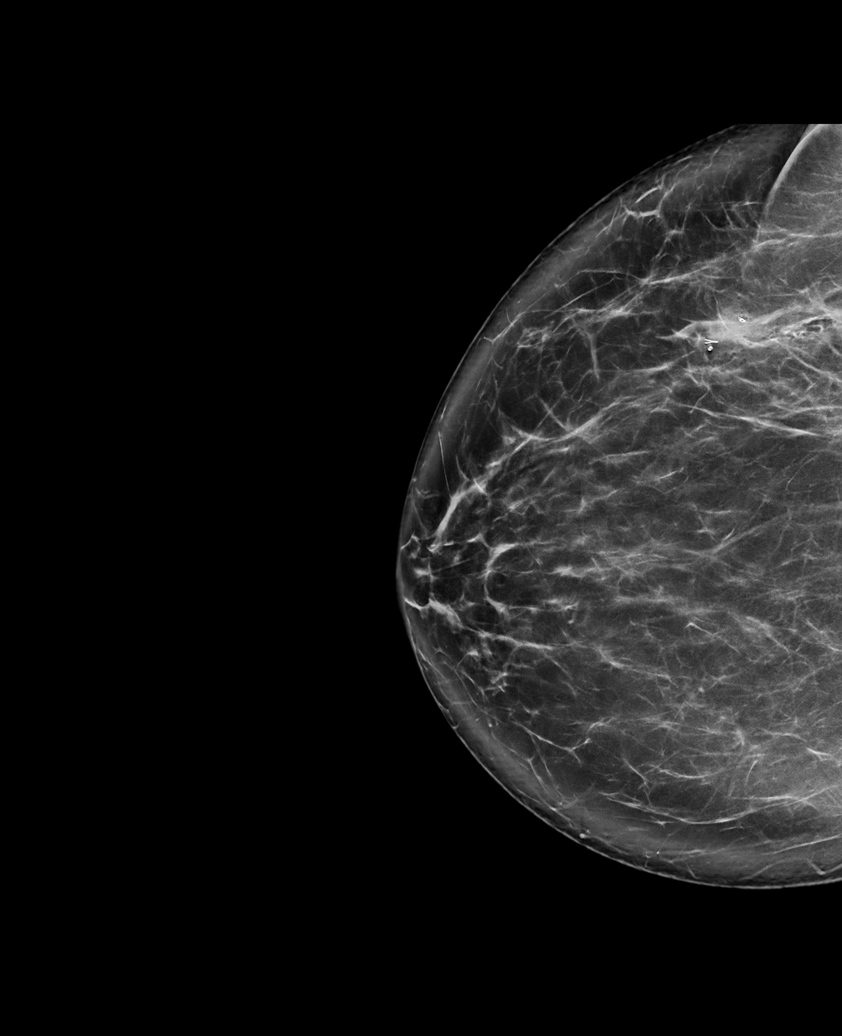

[R CC]
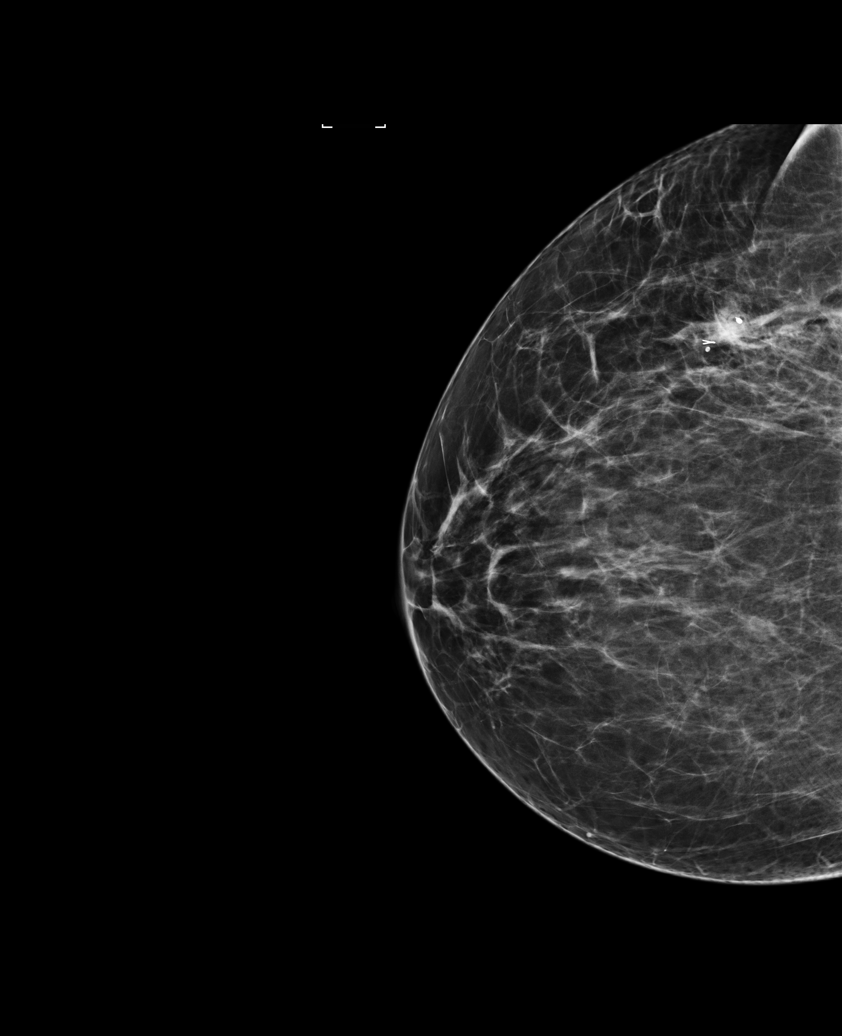

[R ML tomo · tomo slice 45/89.0]
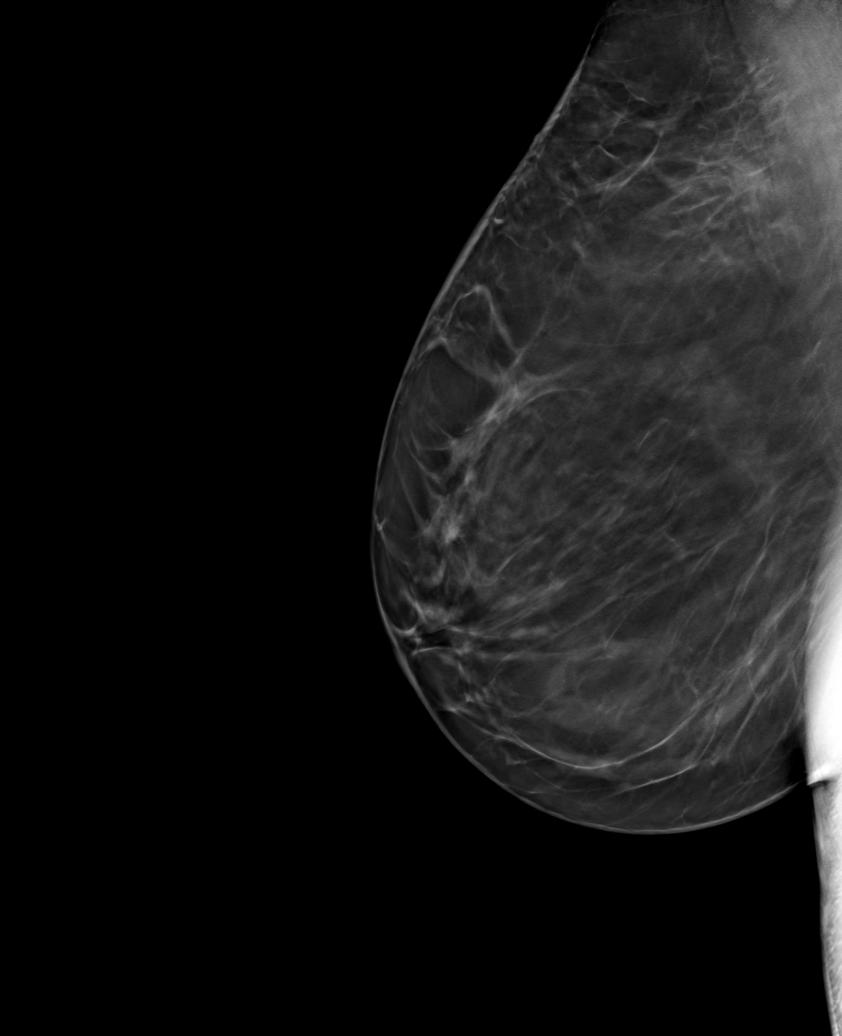

[R CC tomo · tomo slice 48/95.0]
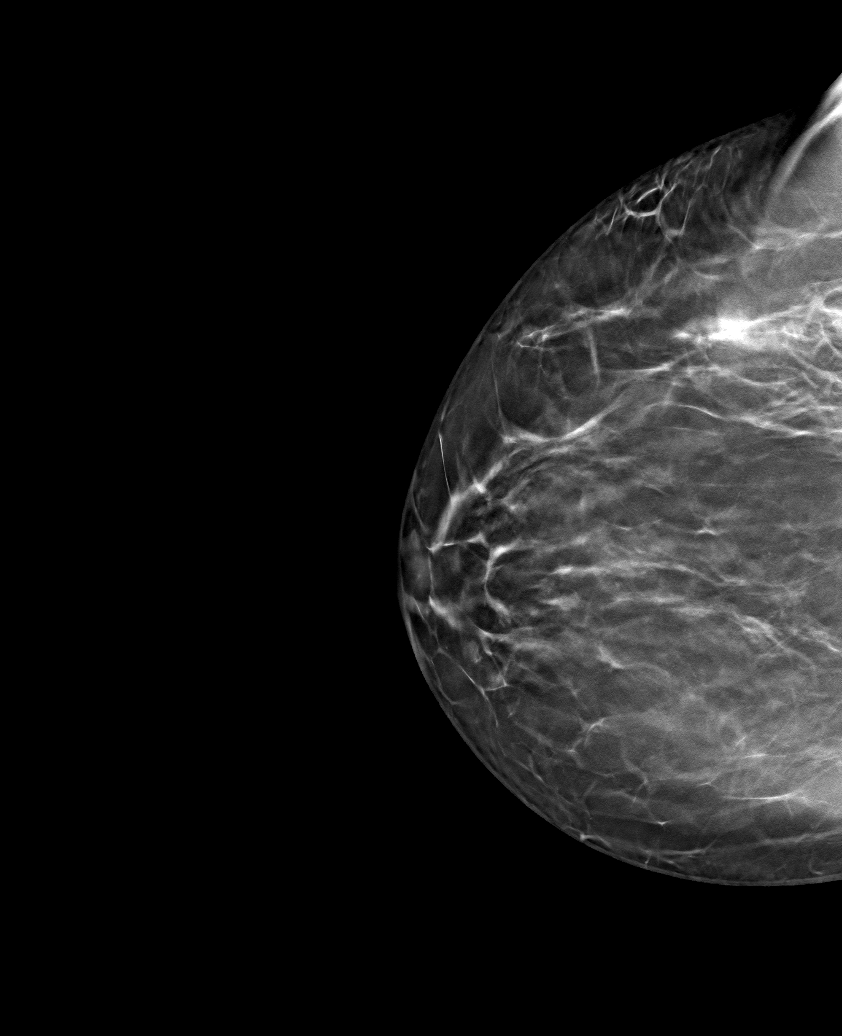

[6 of 14 positions shown; findings below may reference images not displayed]

FINDINGS: Mammographic images were obtained following stereotactic guided
biopsy of mass in the upper-outer quadrant of the right breast. A
coil shaped clip is identified in the upper-outer quadrant following
biopsy, within the expected location, caudal to the ribbon shaped
clip placed the time of previous benign biopsy showing fibrocystic
changes.
IMPRESSION: Tissue marker clip in the expected location following biopsy.

Final Assessment: Post Procedure Mammograms for Marker Placement

## 2018-12-12 IMAGING — MG STEREOTACTIC VACUUM ASSIST RIGHT
8 of 9 series · 8 of 17 positions shown · non-contrast
Comparison: Previous exams.

ADDENDUM:
Pathology revealed FIBROCYSTIC CHANGE of the Right breast, upper
outer. This was found to be concordant by Dr. Blain Jumper.
Pathology results were discussed with the patient by telephone. The
patient reported doing well after the biopsy with tenderness at the
site. Post biopsy instructions and care were reviewed and questions
were answered. The patient was encouraged to call The [REDACTED] for any additional concerns. The patient was
instructed to return for right diagnostic mammography in 6 months
and informed a reminder notice would be sent regarding this
appointment.

Pathology results reported by Arjani Rusyaidi, RN on 01/01/2017.
CLINICAL DATA: The patient presents for stereotactic guided core
biopsy of mass in the upper-outer quadrant of the right breast.
EXAM:
RIGHT BREAST STEREOTACTIC CORE NEEDLE BIOPSY

[R CC (1 of 7)]
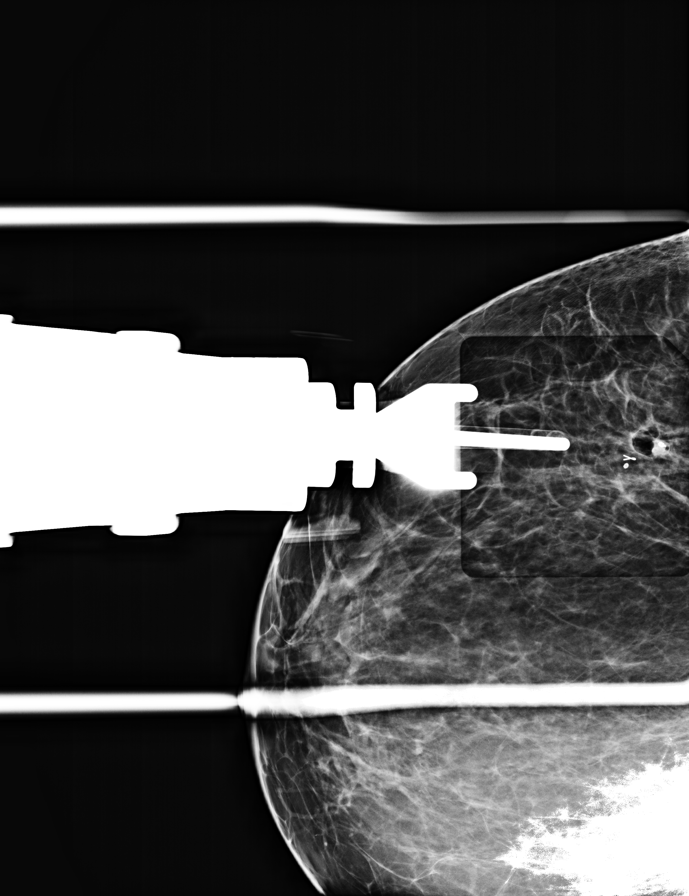

[R CC (2 of 7)]
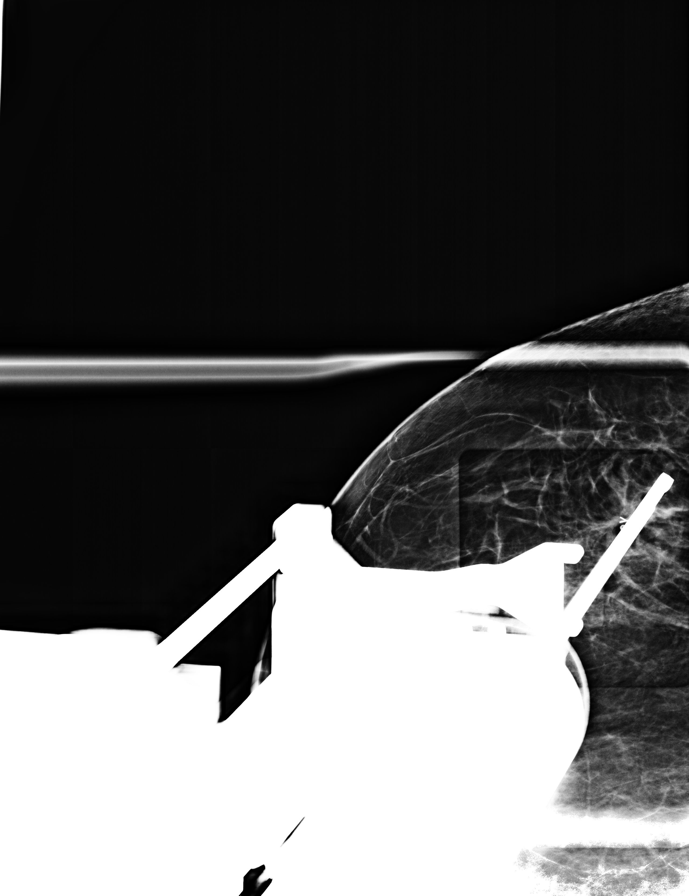

[R CC (3 of 7)]
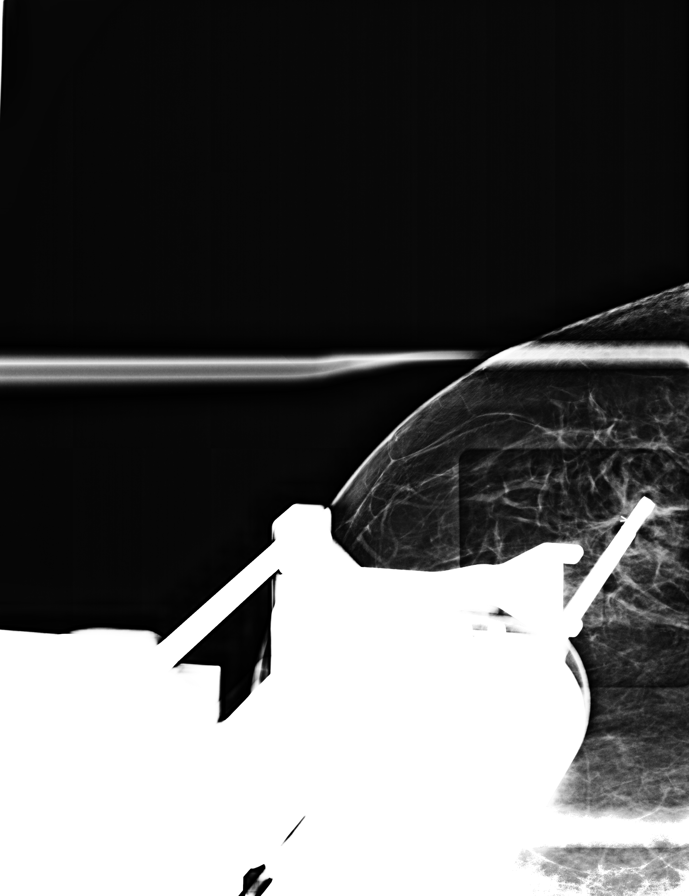

[R CC (4 of 7)]
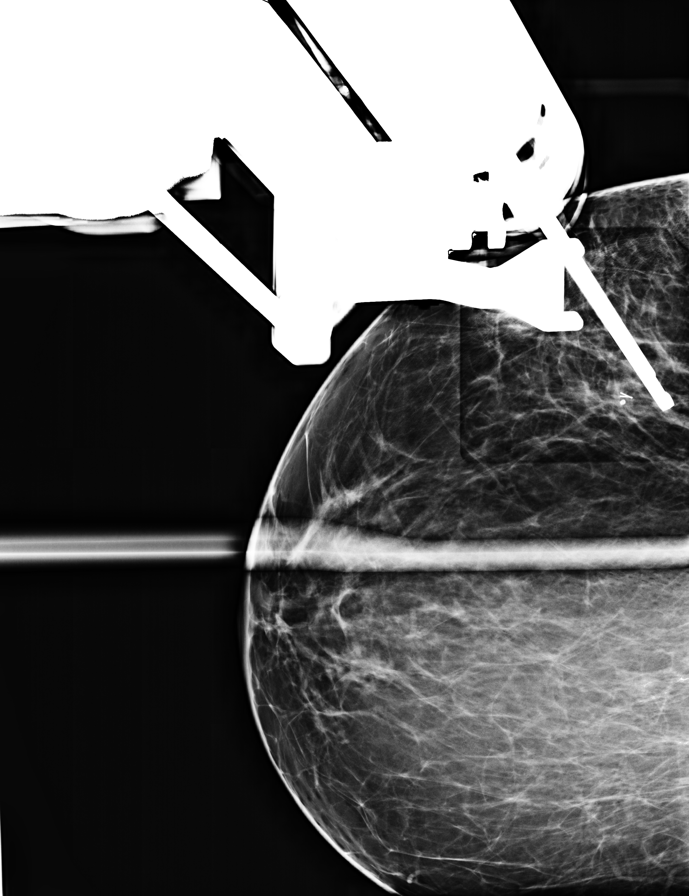

[R CC (5 of 7)]
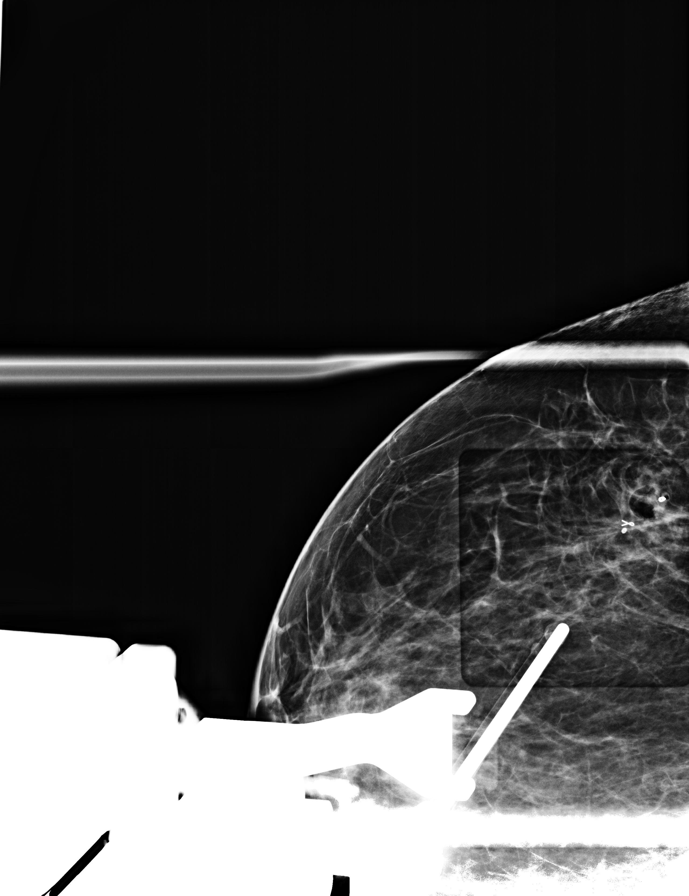

[R CC (6 of 7)]
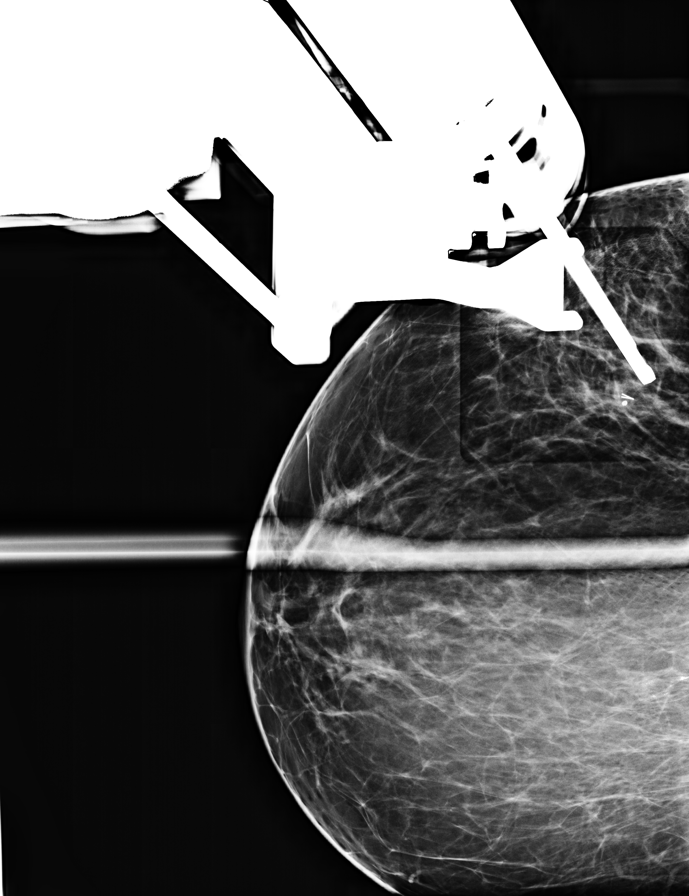

[R CC (7 of 7)]
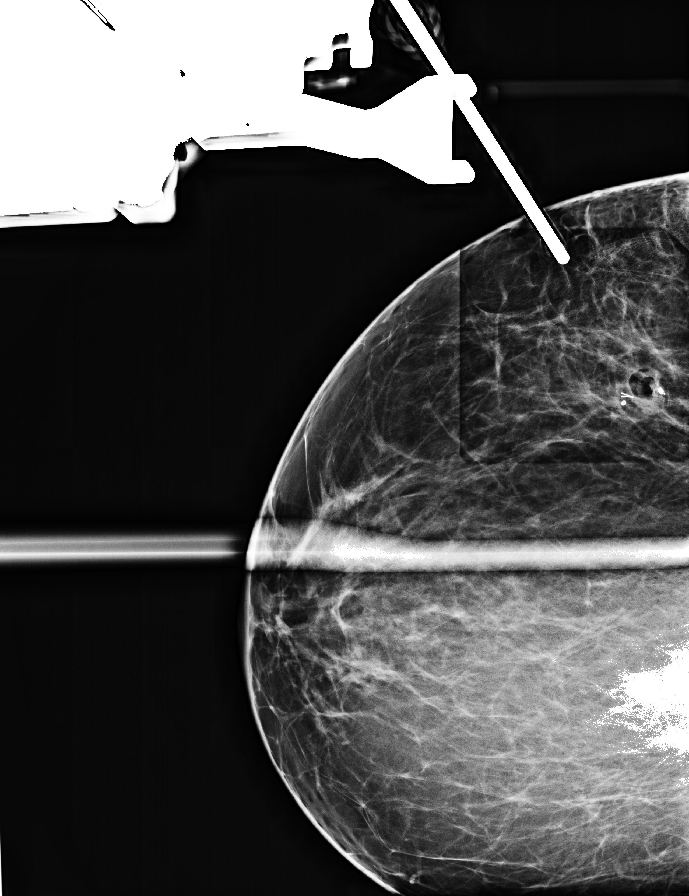

[R CC tomo · tomo slice 37/74.0]
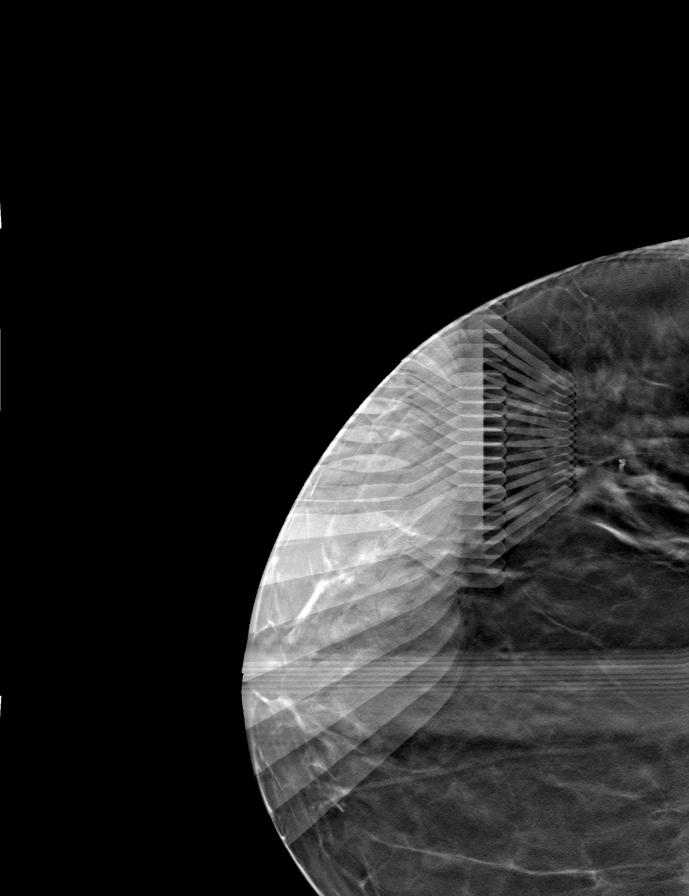

[8 of 17 positions shown; findings below may reference images not displayed]



Using sterile technique and 1% Lidocaine as local anesthetic, under
stereotactic guidance, a 9 gauge vacuum assisted device was used to
perform core needle biopsy of mass within the upper-outer quadrant
of the right breast using a superior to inferior approach.

Lesion quadrant: Upper-outer quadrant right breast

At the conclusion of the procedure, a coil shaped tissue marker clip
was deployed into the biopsy cavity. Follow-up 2-view mammogram was
performed and dictated separately.
IMPRESSION: Stereotactic-guided biopsy of mass in the upper-outer quadrant of
the right breast. No apparent complications.

## 2019-04-04 ENCOUNTER — Telehealth: Payer: Self-pay | Admitting: Internal Medicine

## 2019-04-04 NOTE — Telephone Encounter (Signed)
Patient states she was exposed to covid, her husband tested positive.  Patient has a covid test scheduled tomorrow 1/13.  Patient is having body aches, fever, sinus drainage,  Patient is requesting PCP to call her in some medication to help. Call back [phone]  [id number] [place], [place] - [address] AT [facility] [address] Phone:  [phone]  Fax:  [phone]

## 2019-04-04 NOTE — Telephone Encounter (Signed)
   Patient requesting medication for symptoms. Virtual visit scheduled for 1/13. Patient scheduled for covid test 1/13

## 2019-04-04 NOTE — Telephone Encounter (Signed)
Pt called back since she hasnt heard from office and check the pharmacy for medications sent/ please advise

## 2019-04-05 ENCOUNTER — Ambulatory Visit (INDEPENDENT_AMBULATORY_CARE_PROVIDER_SITE_OTHER): Payer: 59 | Admitting: Family

## 2019-04-05 ENCOUNTER — Encounter: Payer: Self-pay | Admitting: Family

## 2019-04-05 DIAGNOSIS — J45901 Unspecified asthma with (acute) exacerbation: Secondary | ICD-10-CM

## 2019-04-05 DIAGNOSIS — J069 Acute upper respiratory infection, unspecified: Secondary | ICD-10-CM | POA: Diagnosis not present

## 2019-04-05 DIAGNOSIS — Z20828 Contact with and (suspected) exposure to other viral communicable diseases: Secondary | ICD-10-CM | POA: Diagnosis not present

## 2019-04-05 MED ORDER — ALBUTEROL SULFATE HFA 108 (90 BASE) MCG/ACT IN AERS: INHALATION_SPRAY | RESPIRATORY_TRACT | 0 refills | Status: DC

## 2019-04-05 MED ORDER — ONDANSETRON HCL 4 MG PO TABS: 4.0000 mg | ORAL_TABLET | Freq: Three times a day (TID) | ORAL | 0 refills | Status: DC | PRN

## 2019-04-05 MED ORDER — FLUTICASONE PROPIONATE 50 MCG/ACT NA SUSP: 2.0000 | Freq: Every day | NASAL | 6 refills | Status: DC

## 2019-04-05 NOTE — Progress Notes (Signed)
Megan Medina is a 54 y.o. female with the following history as recorded in EpicCare:  Patient Active Problem List   Diagnosis Date Noted  . Colitis, acute 01/15/2017  . Hyponatremia 01/15/2017  . Anxiety 01/15/2017  . Pancolitis ([place]) 01/15/2017  . Coughing 06/16/2016  . Respiratory infection 05/30/2016  . Acute sinus infection 10/06/2013  . IBS (irritable bowel syndrome) 09/23/2010  . ALLERGIC RHINITIS 04/01/2007  . Asthma 04/01/2007  . ABNORMAL PAP SMEAR 04/01/2007    Current Outpatient Medications  Medication Sig Dispense Refill  . albuterol (VENTOLIN HFA) 108 (90 Base) MCG/ACT inhaler INHALE 2 PUFFS INTO THE LUNGS EVERY 6 HOURS AS NEEDED FOR WHEEZING/SHORTNESS OF BREATH *NEED APPT* 8 g 0  . escitalopram (LEXAPRO) 20 MG tablet Take 20 mg by mouth daily.    . fluticasone (FLONASE) 50 MCG/ACT nasal spray Place 2 sprays into both nostrils daily. (Patient not taking: Reported on 02/09/2018) 16 g 6  . meclizine (ANTIVERT) 25 MG tablet Take 1 tablet (25 mg total) by mouth 3 (three) times daily as needed for dizziness. 30 tablet 1  . ondansetron (ZOFRAN) 4 MG tablet Take 1 tablet (4 mg total) by mouth every 8 (eight) hours as needed for nausea or vomiting. 20 tablet 1  . valACYclovir (VALTREX) 500 MG tablet Take 1 tablet by mouth daily.     No current facility-administered medications for this visit.    Allergies: Clindamycin/lincomycin, Codeine phosphate, Hydrocodone, and Methylprednisolone  Past Medical History:  Diagnosis Date  . Allergic rhinitis   . Asthma   . IBS (irritable bowel syndrome)   . Pancolitis [facility])     Past Surgical History:  Procedure Laterality Date  . PARTIAL HYSTERECTOMY     vaginal  . TONSILLECTOMY      Family History  Problem Relation Age of Onset  . Diverticulosis Mother   . Inflammatory bowel disease Neg Hx     Social History   Tobacco Use  . Smoking status: Never Smoker  . Smokeless tobacco: Never Used  Substance Use Topics  .  Alcohol use: No    Subjective:    I connected with [name] on 04/05/19 at  8:40 AM EST by a video enabled telemedicine application and verified that I am speaking with the correct person using two identifiers.   I discussed the limitations of evaluation and management by telemedicine and the availability of in person appointments. The patient expressed understanding and agreed to proceed. Provider in office/ patient is at home; provider and patient are only 2 people on video call.   Patient is scheduled for a COVID test later today; her husband has tested positive; is requesting medication for symptoms. + nausea, sinus congestion; requesting a Z-pak and prednisone specifically; has been taking OTC Sudafed PE with limited benefit- "just really need something to help with my sinus congestion." Not having to use her rescue inhaler; Would like something to help with nausea;    Objective:  There were no vitals filed for this visit.  General: Well developed, well nourished, in no acute distress  Skin : Warm and dry.  Head: Normocephalic and atraumatic  Lungs: Respirations unlabored;  Neurologic: Alert and oriented; speech intact; face symmetrical;   Assessment:  1. Viral URI with cough     Plan:  Suspect COVID infection; patient is scheduled for testing today; discussed symptomatic treatment including Sudafed, Flonase; increase fluids, rest and follow-up worse, no better. Will refill Zofran to help with nausea; encouraged to quarantine until test  results are obtained.   No follow-ups on file.  No orders of the defined types were placed in this encounter.   Requested Prescriptions    No prescriptions requested or ordered in this encounter

## 2019-05-02 DIAGNOSIS — U071 COVID-19: Secondary | ICD-10-CM | POA: Insufficient documentation

## 2019-06-16 ENCOUNTER — Ambulatory Visit: Payer: 59 | Attending: Internal Medicine

## 2019-06-16 DIAGNOSIS — Z23 Encounter for immunization: Secondary | ICD-10-CM

## 2019-06-16 NOTE — Progress Notes (Signed)
   Covid-19 Vaccination Clinic  Name:  Megan Medina    MRN: [id number] DOB: [birth year]  06/16/2019  [name] was observed post Covid-19 immunization for 15 minutes without incident. She was provided with Vaccine Information Sheet and instruction to access the V-Safe system.   [name] was instructed to call 911 with any severe reactions post vaccine: Marland Kitchen Difficulty breathing  . Swelling of face and throat  . A fast heartbeat  . A bad rash all over body  . Dizziness and weakness   Immunizations Administered    Name Date Dose VIS Date Route   Pfizer COVID-19 Vaccine 06/16/2019  2:26 PM 0.3 mL 03/03/2019 Intramuscular   Manufacturer: [employer], [employer]   Lot: [id number]   NDC: [address]-8

## 2019-07-12 ENCOUNTER — Ambulatory Visit: Payer: 59

## 2019-11-03 ENCOUNTER — Telehealth (INDEPENDENT_AMBULATORY_CARE_PROVIDER_SITE_OTHER): Payer: 59 | Admitting: Internal Medicine

## 2019-11-03 DIAGNOSIS — J4520 Mild intermittent asthma, uncomplicated: Secondary | ICD-10-CM

## 2019-11-03 DIAGNOSIS — J988 Other specified respiratory disorders: Secondary | ICD-10-CM | POA: Diagnosis not present

## 2019-11-03 DIAGNOSIS — F419 Anxiety disorder, unspecified: Secondary | ICD-10-CM | POA: Diagnosis not present

## 2019-11-03 MED ORDER — PREDNISONE 10 MG PO TABS: ORAL_TABLET | ORAL | 0 refills | Status: DC

## 2019-11-03 MED ORDER — AMOXICILLIN-POT CLAVULANATE 875-125 MG PO TABS: 1.0000 | ORAL_TABLET | Freq: Two times a day (BID) | ORAL | 0 refills | Status: DC

## 2019-11-03 NOTE — Progress Notes (Signed)
Patient ID: Megan Medina, female   DOB: [birth year], 53 y.o.   MRN: [id number]  Virtual Visit via Video Note  I connected with Megan Medina on 11/03/19 at 10:20 AM EDT by a video enabled telemedicine application and verified that I am speaking with the correct person using two identifiers.  Location of all participants today Patient: at home Provider: at office   I discussed the limitations of evaluation and management by telemedicine and the availability of in person appointments. The patient expressed understanding and agreed to proceed.  History of Present Illness: Here with acute onset mild to mod 2-3 days ST, HA, general weakness and malaise, with prod cough greenish sputum, but Pt denies chest pain, increased sob or doe, wheezing, orthopnea, PND, increased LE swelling, palpitations, dizziness or syncope.  Was covid neg recently.  Pt denies new neurological symptoms such as new headache, or facial or extremity weakness or numbness   Pt denies polydipsia, polyuria  Denies worsening depressive symptoms, suicidal ideation, or panic Past Medical History:  Diagnosis Date  . Allergic rhinitis   . Asthma   . IBS (irritable bowel syndrome)   . Pancolitis [facility])    Past Surgical History:  Procedure Laterality Date  . PARTIAL HYSTERECTOMY     vaginal  . TONSILLECTOMY      reports that she has never smoked. She has never used smokeless tobacco. She reports that she does not drink alcohol and does not use drugs. family history includes Diverticulosis in her mother. Allergies  Allergen Reactions  . Clindamycin/Lincomycin     C. diff  . Codeine Phosphate     REACTION: unspecified  . Hydrocodone Nausea And Vomiting  . Methylprednisolone     A local reaction to DepoMedrol   Current Outpatient Medications on File Prior to Visit  Medication Sig Dispense Refill  . albuterol (VENTOLIN HFA) 108 (90 Base) MCG/ACT inhaler INHALE 2 PUFFS INTO THE LUNGS EVERY 6 HOURS AS NEEDED  FOR WHEEZING/SHORTNESS OF BREATH *NEED APPT* 8 g 0  . escitalopram (LEXAPRO) 20 MG tablet Take 20 mg by mouth daily.    . fluticasone (FLONASE) 50 MCG/ACT nasal spray Place 2 sprays into both nostrils daily. 16 g 6  . meclizine (ANTIVERT) 25 MG tablet Take 1 tablet (25 mg total) by mouth 3 (three) times daily as needed for dizziness. 30 tablet 1  . ondansetron (ZOFRAN) 4 MG tablet Take 1 tablet (4 mg total) by mouth every 8 (eight) hours as needed for nausea or vomiting. 20 tablet 0  . valACYclovir (VALTREX) 500 MG tablet Take 1 tablet by mouth daily.     No current facility-administered medications on file prior to visit.    Observations/Objective: Alert, NAD, appropriate mood and affect, resps normal, cn 2-12 intact, moves all 4s, no visible rash or swelling Lab Results  Component Value Date   WBC 17.8 (H) 01/16/2017   HGB 13.5 01/16/2017   HCT 40.2 01/16/2017   PLT 326 01/16/2017   GLUCOSE 100 (H) 01/17/2017   ALT 16 01/15/2017   AST 15 01/15/2017   NA 137 01/17/2017   K 3.9 01/17/2017   CL 106 01/17/2017   CREATININE 0.65 01/17/2017   BUN 6 01/17/2017   CO2 24 01/17/2017   TSH 1.37 09/23/2010   Assessment and Plan: See notes  Follow Up Instructions: See notes   I discussed the assessment and treatment plan with the patient. The patient was provided an opportunity to ask questions and all were answered. The patient  agreed with the plan and demonstrated an understanding of the instructions.   The patient was advised to call back or seek an in-person evaluation if the symptoms worsen or if the condition fails to improve as anticipated.  Oliver Barre, MD

## 2019-11-04 ENCOUNTER — Encounter: Payer: Self-pay | Admitting: Internal Medicine

## 2019-11-04 NOTE — Patient Instructions (Signed)
Please take all new medication as prescribed 

## 2019-11-04 NOTE — Assessment & Plan Note (Addendum)
Mild to mod, can r/o bronchitis vs pna, declines cxr, for antibx course,  to f/u any worsening symptoms or concerns  I spent 31 minutes in preparing to see the patient by review of recent labs, imaging and procedures, obtaining and reviewing separately obtained history, communicating with the patient and family or caregiver, ordering medications, tests or procedures, and documenting clinical information in the EHR including the differential Dx, treatment, and any further evaluation and other management of resp infection, asthma, anxiety

## 2019-11-04 NOTE — Assessment & Plan Note (Signed)
stable overall by history and exam, recent data reviewed with pt, and pt to continue medical treatment as before,  to f/u any worsening symptoms or concerns  

## 2020-03-01 ENCOUNTER — Telehealth (INDEPENDENT_AMBULATORY_CARE_PROVIDER_SITE_OTHER): Payer: 59 | Admitting: Internal Medicine

## 2020-03-01 ENCOUNTER — Encounter: Payer: Self-pay | Admitting: Internal Medicine

## 2020-03-01 DIAGNOSIS — J988 Other specified respiratory disorders: Secondary | ICD-10-CM | POA: Diagnosis not present

## 2020-03-01 DIAGNOSIS — J45901 Unspecified asthma with (acute) exacerbation: Secondary | ICD-10-CM

## 2020-03-01 MED ORDER — ALBUTEROL SULFATE HFA 108 (90 BASE) MCG/ACT IN AERS: INHALATION_SPRAY | RESPIRATORY_TRACT | 0 refills | Status: DC

## 2020-03-01 MED ORDER — AMOXICILLIN-POT CLAVULANATE 875-125 MG PO TABS: 1.0000 | ORAL_TABLET | Freq: Two times a day (BID) | ORAL | 0 refills | Status: DC

## 2020-03-01 MED ORDER — PREDNISONE 10 MG PO TABS: ORAL_TABLET | ORAL | 0 refills | Status: DC

## 2020-03-01 NOTE — Assessment & Plan Note (Signed)
With flare today. Rx prednisone, albuterol and augmentin. Covid-19 testing today.

## 2020-03-01 NOTE — Progress Notes (Signed)
Virtual Visit via Audio Note  I connected with Megan Medina on 03/01/20 at  8:40 AM EST by an audio enabled telemedicine application and verified that I am speaking with the correct person using two identifiers.  The patient and the provider were at separate locations throughout the entire encounter. Patient location: home, Provider location: work   I discussed the limitations of evaluation and management by telemedicine and the availability of in person appointments. The patient expressed understanding and agreed to proceed. The patient and the provider were the only parties present for the visit unless noted in HPI below.  History of Present Illness: The patient is a 54 y.o. female with visit for cough and congestion. Started Tues/Wednesday. Has been worsening. Her husband has same and covid-19 test pending. Pretty bad cough and coughing up yellow mucus. She denies SOB. Had fever to 100 F last night. Overall worsening. Is using albuterol inhaler lately and it is helping some. Not taking flonase. Denies muscle aches. Taste is normal. Overall it is worsening. Has had 2 covid-19 vaccines but no booster. Has tried albuterol.   Observations/Objective: A and O times 3, voice strong, no dyspnea and talking in full sentences  Assessment and Plan: See problem oriented charting  Follow Up Instructions: refill albuterol, start flonase, rx augmentin and prednisone. Covid-19 testing today, if positive would be a candidate for monoclonal antibody  Visit time 12 minutes in face to face communication with patient and coordination of care  I discussed the assessment and treatment plan with the patient. The patient was provided an opportunity to ask questions and all were answered. The patient agreed with the plan and demonstrated an understanding of the instructions.   The patient was advised to call back or seek an in-person evaluation if the symptoms worsen or if the condition fails to improve as  anticipated.  Myrlene Broker, MD

## 2020-03-01 NOTE — Assessment & Plan Note (Signed)
Needs covid-19 testing as no booster. Rx albuterol and prednisone and augmentin in case asthma flare. Start using flonase.

## 2020-03-01 NOTE — Progress Notes (Signed)
Pt called at 0858 to schedule COVID test.  Pt states she can be here in .  Instructions given re: test will be done in vehicle & calling office upon arrival.  Pt verb understanding.

## 2020-03-06 ENCOUNTER — Emergency Department (HOSPITAL_BASED_OUTPATIENT_CLINIC_OR_DEPARTMENT_OTHER)
Admission: EM | Admit: 2020-03-06 | Discharge: 2020-03-06 | Disposition: A | Discharge status: Home / Self Care | Payer: 59 | Attending: Emergency Medicine | Admitting: Emergency Medicine

## 2020-03-06 ENCOUNTER — Other Ambulatory Visit: Payer: Self-pay

## 2020-03-06 ENCOUNTER — Encounter: Payer: Self-pay | Admitting: Family Medicine

## 2020-03-06 ENCOUNTER — Encounter (HOSPITAL_BASED_OUTPATIENT_CLINIC_OR_DEPARTMENT_OTHER): Payer: Self-pay

## 2020-03-06 ENCOUNTER — Telehealth (INDEPENDENT_AMBULATORY_CARE_PROVIDER_SITE_OTHER): Payer: 59 | Admitting: Family Medicine

## 2020-03-06 ENCOUNTER — Telehealth: Payer: Self-pay

## 2020-03-06 DIAGNOSIS — Z7951 Long term (current) use of inhaled steroids: Secondary | ICD-10-CM | POA: Insufficient documentation

## 2020-03-06 DIAGNOSIS — J45909 Unspecified asthma, uncomplicated: Secondary | ICD-10-CM | POA: Diagnosis not present

## 2020-03-06 DIAGNOSIS — M545 Low back pain, unspecified: Secondary | ICD-10-CM

## 2020-03-06 LAB — URINALYSIS, ROUTINE W REFLEX MICROSCOPIC
Bilirubin Urine: NEGATIVE
Glucose, UA: NEGATIVE mg/dL
Hgb urine dipstick: NEGATIVE
Ketones, ur: NEGATIVE mg/dL
Leukocytes,Ua: NEGATIVE
Nitrite: NEGATIVE
Protein, ur: NEGATIVE mg/dL
Specific Gravity, Urine: 1.025 (ref 1.005–1.030)
pH: 6 (ref 5.0–8.0)

## 2020-03-06 MED ORDER — OXYCODONE HCL 5 MG PO TABS
5.0000 mg | ORAL_TABLET | Freq: Once | ORAL | Status: AC
Start: 2020-03-06 — End: 2020-03-06
  Administered 2020-03-06: 5 mg via ORAL
  Filled 2020-03-06: qty 1

## 2020-03-06 MED ORDER — ACETAMINOPHEN 500 MG PO TABS
1000.0000 mg | ORAL_TABLET | Freq: Once | ORAL | Status: AC
  Administered 2020-03-06: 1000 mg via ORAL
  Filled 2020-03-06: qty 2

## 2020-03-06 MED ORDER — KETOROLAC TROMETHAMINE 60 MG/2ML IM SOLN
15.0000 mg | Freq: Once | INTRAMUSCULAR | Status: AC
  Administered 2020-03-06: 15 mg via INTRAMUSCULAR
  Filled 2020-03-06: qty 2

## 2020-03-06 MED ORDER — TRAMADOL HCL 50 MG PO TABS: 50.0000 mg | ORAL_TABLET | Freq: Three times a day (TID) | ORAL | 0 refills | Status: AC | PRN

## 2020-03-06 MED ORDER — METHOCARBAMOL 500 MG PO TABS: 500.0000 mg | ORAL_TABLET | Freq: Four times a day (QID) | ORAL | 1 refills | Status: DC

## 2020-03-06 MED ORDER — DIAZEPAM 5 MG PO TABS
5.0000 mg | ORAL_TABLET | Freq: Once | ORAL | Status: AC
  Administered 2020-03-06: 5 mg via ORAL
  Filled 2020-03-06: qty 1

## 2020-03-06 NOTE — ED Notes (Signed)
Pain does not worsen/improve with certain movements, states pain comes in waves. Unable to sit still. No tenderness upon palpation. Denies urinary symptoms

## 2020-03-06 NOTE — ED Triage Notes (Addendum)
Pt c/o right lower back pain x 2-3 days-denies injury-pt with constant motion either sitting or standing

## 2020-03-06 NOTE — Discharge Instructions (Signed)

## 2020-03-06 NOTE — Telephone Encounter (Signed)
Pt notified that [facility] was called; rep states that test had to be repeated, that this sometimes happen & has nothing to do with the way the test was collected; did not provide when results would be available.  Pt verb understanding.

## 2020-03-06 NOTE — Progress Notes (Signed)
Called [facility] for Covid test results.  Rep states the test is still being processed b/c it had to be repeat tested.  No ETA at this point per representative.

## 2020-03-06 NOTE — ED Provider Notes (Signed)
[facility] EMERGENCY DEPARTMENT Provider Note   CSN: [id number] Arrival date & time: 03/06/20  1837     History Chief Complaint  Patient presents with  . Back Pain    Megan Medina is a 54 y.o. female.  54 yo F with a chief complaints of right-sided low back pain.  This is been coming and going.  Worse with certain positions and movement.  She had recently been diagnosed with an upper respiratory illness and had some significant and forceful coughing.  She thinks maybe she strained a muscle.  She had seen her family doctor and was prescribed tramadol and a muscle relaxant.  She is a bit concerned to take tramadol and so was taken the muscle relaxant but did not feel like it was helping.  Today she had significant worsening of her pain this morning when she got up from sleep.  It is gotten significantly better throughout the day.  She denies any abdominal pain denies leg numbness or weakness denies loss of bowel or bladder denies loss of peritoneal sensation.  Denies overt trauma to the back.  Denies fevers.  The history is provided by the patient.  Back Pain Location:  Lumbar spine Quality:  Aching Radiates to:  Does not radiate Pain severity:  Moderate Onset quality:  Gradual Duration:  3 days Timing:  Constant Progression:  Worsening Chronicity:  New Relieved by:  None tried Worsened by:  Nothing Ineffective treatments:  Muscle relaxants Associated symptoms: no chest pain, no dysuria, no fever and no headaches        Past Medical History:  Diagnosis Date  . Allergic rhinitis   . Asthma   . IBS (irritable bowel syndrome)   . Pancolitis [facility])     Patient Active Problem List   Diagnosis Date Noted  . Colitis, acute 01/15/2017  . Hyponatremia 01/15/2017  . Anxiety 01/15/2017  . Pancolitis (HCC) 01/15/2017  . Coughing 06/16/2016  . Respiratory infection 05/30/2016  . IBS (irritable bowel syndrome) 09/23/2010  . ALLERGIC RHINITIS 04/01/2007  .  Asthma 04/01/2007  . ABNORMAL PAP SMEAR 04/01/2007    Past Surgical History:  Procedure Laterality Date  . PARTIAL HYSTERECTOMY     vaginal  . TONSILLECTOMY       OB History   No obstetric history on file.     Family History  Problem Relation Age of Onset  . Diverticulosis Mother   . Inflammatory bowel disease Neg Hx     Social History   Tobacco Use  . Smoking status: Never Smoker  . Smokeless tobacco: Never Used  Substance Use Topics  . Alcohol use: No  . Drug use: No    Home Medications Prior to Admission medications   Medication Sig Start Date End Date Taking? Authorizing Provider  albuterol (VENTOLIN HFA) 108 (90 Base) MCG/ACT inhaler INHALE 2 PUFFS INTO THE LUNGS EVERY 6 HOURS AS NEEDED FOR WHEEZING/SHORTNESS OF BREATH *NEED APPT* 03/01/20   Myrlene Broker, MD  amoxicillin-clavulanate (AUGMENTIN) 875-125 MG tablet Take 1 tablet by mouth 2 (two) times daily. 03/01/20   Myrlene Broker, MD  escitalopram (LEXAPRO) 20 MG tablet Take 20 mg by mouth daily.    [provider]  fluticasone (FLONASE) 50 MCG/ACT nasal spray Place 2 sprays into both nostrils daily. 04/05/19   Olive Bass, FNP  meclizine (ANTIVERT) 25 MG tablet Take 1 tablet (25 mg total) by mouth 3 (three) times daily as needed for dizziness. 07/20/17   Plotnikov, Georgina Quint,  MD  methocarbamol (ROBAXIN) 500 MG tablet Take 1 tablet (500 mg total) by mouth 4 (four) times daily. 03/06/20   Seabron Spates R, DO  ondansetron (ZOFRAN) 4 MG tablet Take 1 tablet (4 mg total) by mouth every 8 (eight) hours as needed for nausea or vomiting. 04/05/19   Olive Bass, FNP  predniSONE (DELTASONE) 10 MG tablet 3 tabs by mouth per day for 3 days,2tabs per day for 3 days,1tab per day for 3 days 03/01/20   Myrlene Broker, MD  traMADol (ULTRAM) 50 MG tablet Take 1 tablet (50 mg total) by mouth every 8 (eight) hours as needed for up to 5 days. 03/06/20 03/11/20  Donato Schultz, DO  valACYclovir (VALTREX) 500 MG tablet Take 1 tablet by mouth daily. 11/20/15   [provider]    Allergies    Clindamycin/lincomycin, Codeine phosphate, Hydrocodone, and Methylprednisolone  Review of Systems   Review of Systems  Constitutional: Negative for chills and fever.  HENT: Negative for congestion and rhinorrhea.   Eyes: Negative for redness and visual disturbance.  Respiratory: Negative for shortness of breath and wheezing.   Cardiovascular: Negative for chest pain and palpitations.  Gastrointestinal: Negative for nausea and vomiting.  Genitourinary: Negative for dysuria and urgency.  Musculoskeletal: Positive for back pain. Negative for arthralgias and myalgias.  Skin: Negative for pallor and wound.  Neurological: Negative for dizziness and headaches.    Physical Exam Updated Vital Signs BP (!) 155/88 (BP Location: Right Arm)   Pulse 72   Temp 99 F (37.2 C) (Oral)   Resp 16   Ht 5\' 4"  (1.626 m)   Wt 79.8 kg   SpO2 99%   BMI 30.21 kg/m   Physical Exam Vitals and nursing note reviewed.  Constitutional:      General: She is not in acute distress.    Appearance: She is well-developed and well-nourished. She is not diaphoretic.  HENT:     Head: Normocephalic and atraumatic.  Eyes:     Extraocular Movements: EOM normal.     Pupils: Pupils are equal, round, and reactive to light.  Cardiovascular:     Rate and Rhythm: Normal rate and regular rhythm.     Heart sounds: No murmur heard. No friction rub. No gallop.   Pulmonary:     Effort: Pulmonary effort is normal.     Breath sounds: No wheezing or rales.  Abdominal:     General: There is no distension.     Palpations: Abdomen is soft.     Tenderness: There is no abdominal tenderness.  Musculoskeletal:        General: Tenderness present. No edema.     Cervical back: Normal range of motion and neck supple.     Comments: No midline spinal tenderness step-offs or deformities.  There is some mild  paraspinal musculature tenderness about the right SI joint.  No pain to the piriformis.  Pulse motor and sensation are intact distally.  Reflexes are 2+ and equal to the other side.  Ambulating without difficulty.  Skin:    General: Skin is warm and dry.  Neurological:     Mental Status: She is alert and oriented to person, place, and time.  Psychiatric:        Mood and Affect: Mood and affect normal.        Behavior: Behavior normal.     ED Results / Procedures / Treatments   Labs (all labs ordered are listed, but only  abnormal results are displayed) Labs Reviewed  URINALYSIS, ROUTINE W REFLEX MICROSCOPIC    EKG None  Radiology No results found.  Procedures Procedures (including critical care time)  Medications Ordered in ED Medications  ketorolac (TORADOL) injection 15 mg (15 mg Intramuscular Given 03/06/20 2019)  acetaminophen (TYLENOL) tablet 1,000 mg (1,000 mg Oral Given 03/06/20 2018)  oxyCODONE (Oxy IR/ROXICODONE) immediate release tablet 5 mg (5 mg Oral Given 03/06/20 2018)  diazepam (VALIUM) tablet 5 mg (5 mg Oral Given 03/06/20 2018)    ED Course  I have reviewed the triage vital signs and the nursing notes.  Pertinent labs & imaging results that were available during my care of the patient were reviewed by me and considered in my medical decision making (see chart for details).    MDM Rules/Calculators/A&P                          54 yo F with a chief complaints of right-sided low back pain.  This has been going on for a few days.  Denies trauma.  Had an upper respiratory illness so she thought she strained a muscle in her back.  I think her presentation is consistent with the same.  We will treat as musculoskeletal.  She had a UA performed in triage without infection without hematuria.  Does not sound like a kidney stone based on history.  Will hold off on imaging at this time.  Treat with Tylenol and NSAIDs.  PCP follow-up.  10:02 PM:  I have discussed the  diagnosis/risks/treatment options with the patient and family and believe the pt to be eligible for discharge home to follow-up with PCP. We also discussed returning to the ED immediately if new or worsening sx occur. We discussed the sx which are most concerning (e.g., sudden worsening pain, fever, inability to tolerate by mouth, cauda equina s/sx) that necessitate immediate return. Medications administered to the patient during their visit and any new prescriptions provided to the patient are listed below.  Medications given during this visit Medications  ketorolac (TORADOL) injection 15 mg (15 mg Intramuscular Given 03/06/20 2019)  acetaminophen (TYLENOL) tablet 1,000 mg (1,000 mg Oral Given 03/06/20 2018)  oxyCODONE (Oxy IR/ROXICODONE) immediate release tablet 5 mg (5 mg Oral Given 03/06/20 2018)  diazepam (VALIUM) tablet 5 mg (5 mg Oral Given 03/06/20 2018)     The patient appears reasonably screen and/or stabilized for discharge and I doubt any other medical condition or other [facility] requiring further screening, evaluation, or treatment in the ED at this time prior to discharge.   Final Clinical Impression(s) / ED Diagnoses Final diagnoses:  Acute right-sided low back pain without sciatica    Rx / DC Orders ED Discharge Orders    None       Melene Plan, DO 03/06/20 2202

## 2020-03-06 NOTE — Progress Notes (Signed)
Virtual Visit via Video Note  I connected with Megan Medina on 03/06/20 at  9:40 AM EST by a video enabled telemedicine application and verified that I am speaking with the correct person using two identifiers.  Location/ persons in visit  Patient: at work  Provider: home    I discussed the limitations of evaluation and management by telemedicine and the availability of in person appointments. The patient expressed understanding and agreed to proceed.  History of Present Illness:  pt is at work and is c/o low back pain due to coughing.  She is on abx and pred for resp infection and is very upset that her covid test is not back yet-- she had it done at [facility] on Friday She is c/o severe back spasms and pain and is requesting a muscle relaxer    Observations/Objective: There were no vitals filed for this visit. There is no height or weight on file to calculate BMI. Pt is unable to sit long due to pain -- is up and down    Assessment and Plan: 1. Acute low back pain without sciatica, unspecified back pain laterality Muscle relaxers and tramadol for night time  Pt is on pred for resp infection  - methocarbamol (ROBAXIN) 500 MG tablet; Take 1 tablet (500 mg total) by mouth 4 (four) times daily.  Dispense: 45 tablet; Refill: 1 - traMADol (ULTRAM) 50 MG tablet; Take 1 tablet (50 mg total) by mouth every 8 (eight) hours as needed for up to 5 days.  Dispense: 15 tablet; Refill: 0  Follow Up Instructions:    I discussed the assessment and treatment plan with the patient. The patient was provided an opportunity to ask questions and all were answered. The patient agreed with the plan and demonstrated an understanding of the instructions.   The patient was advised to call back or seek an in-person evaluation if the symptoms worsen or if the condition fails to improve as anticipated.  I provided 25 minutes of non-face-to-face time during this encounter.   Donato Schultz,  DO

## 2020-03-07 LAB — NOVEL CORONAVIRUS, NAA

## 2020-03-08 NOTE — Telephone Encounter (Signed)
Pt notified that the results returned with "We are unable to reliably determine a result for the specimen due to  the presence of PCR inhibitor(s) in the specimen submitted. If  clinically indicated, please recollect an additional specimen for  Testing."  Pt declines to have retest at this location, will go to [facility].

## 2020-06-14 ENCOUNTER — Other Ambulatory Visit: Payer: Self-pay | Admitting: Obstetrics and Gynecology

## 2020-06-14 DIAGNOSIS — E785 Hyperlipidemia, unspecified: Secondary | ICD-10-CM

## 2020-07-11 ENCOUNTER — Ambulatory Visit
Admission: RE | Admit: 2020-07-11 | Discharge: 2020-07-11 | Disposition: A | Discharge status: Home / Self Care | Payer: No Typology Code available for payment source | Source: Ambulatory Visit | Attending: Obstetrics and Gynecology | Admitting: Obstetrics and Gynecology

## 2020-07-11 ENCOUNTER — Other Ambulatory Visit: Payer: Self-pay

## 2020-07-11 DIAGNOSIS — E785 Hyperlipidemia, unspecified: Secondary | ICD-10-CM

## 2020-07-30 ENCOUNTER — Telehealth: Payer: Self-pay

## 2020-07-30 NOTE — Telephone Encounter (Signed)
NOTES ON FILE FROM DR JOHN MCCOMB [phone], SENT REFERRAL TO SCHEDULING 

## 2020-08-20 NOTE — Progress Notes (Deleted)
Cardiology Office Note:    Date:  08/20/2020   ID:  Megan Medina, DOB 11-Oct-1965, MRN [id number]  PCP:  Tresa Garter, MD   [facility] Providers Cardiologist:  None {   Referring MD: Richardean Chimera, MD     History of Present Illness:    Megan Medina is a 55 y.o. female with a hx of asthma and IBS who was referred by Dr. Arelia Sneddon for strong family history of CAD.  Past Medical History:  Diagnosis Date  . Allergic rhinitis   . Asthma   . IBS (irritable bowel syndrome)   . Pancolitis [facility])     Past Surgical History:  Procedure Laterality Date  . PARTIAL HYSTERECTOMY     vaginal  . TONSILLECTOMY      Current Medications: No outpatient medications have been marked as taking for the 08/26/20 encounter (Appointment) with Meriam Sprague, MD.     Allergies:   Clindamycin/lincomycin, Codeine phosphate, Hydrocodone, and Methylprednisolone   Social History   Socioeconomic History  . Marital status: Married    Spouse name: Not on file  . Number of children: Not on file  . Years of education: Not on file  . Highest education level: Not on file  Occupational History  . Not on file  Tobacco Use  . Smoking status: Never Smoker  . Smokeless tobacco: Never Used  Substance and Sexual Activity  . Alcohol use: No  . Drug use: No  . Sexual activity: Not on file  Other Topics Concern  . Not on file  Social History Narrative   Regular exercise-yes.   Social Determinants of Health   Financial Resource Strain: Not on file  Food Insecurity: Not on file  Transportation Needs: Not on file  Physical Activity: Not on file  Stress: Not on file  Social Connections: Not on file     Family History: The patient's ***family history includes Diverticulosis in her mother. There is no history of Inflammatory bowel disease.  ROS:   Please see the history of present illness.    *** All other systems reviewed and are negative.  EKGs/Labs/Other  Studies Reviewed:    The following studies were reviewed today: ***  EKG:  EKG is *** ordered today.  The ekg ordered today demonstrates ***  Recent Labs: No results found for requested labs within last 8760 hours.  Recent Lipid Panel No results found for: CHOL, TRIG, HDL, CHOLHDL, VLDL, LDLCALC, LDLDIRECT   Risk Assessment/Calculations:   {Does this patient have ATRIAL FIBRILLATION?[phone]}   Physical Exam:    VS:  There were no vitals taken for this visit.    Wt Readings from Last 3 Encounters:  03/06/20 176 lb (79.8 kg)  02/09/18 173 lb 0.6 oz (78.5 kg)  02/08/17 163 lb 2 oz (74 kg)     GEN: *** Well nourished, well developed in no acute distress HEENT: Normal NECK: No JVD; No carotid bruits LYMPHATICS: No lymphadenopathy CARDIAC: ***RRR, no murmurs, rubs, gallops RESPIRATORY:  Clear to auscultation without rales, wheezing or rhonchi  ABDOMEN: Soft, non-tender, non-distended MUSCULOSKELETAL:  No edema; No deformity  SKIN: Warm and dry NEUROLOGIC:  Alert and oriented x 3 PSYCHIATRIC:  Normal affect   ASSESSMENT:    No diagnosis found. PLAN:    In order of problems listed above:  #Strong Family History of CAD: -Check Ca score for risk stratification -?Lipid panel -Healthy diet and exercise   {Are you ordering a CV Procedure (e.g. stress test, cath, DCCV,  TEE, etc)?   Press F2        :[id number]}    Medication Adjustments/Labs and Tests Ordered: Current medicines are reviewed at length with the patient today.  Concerns regarding medicines are outlined above.  No orders of the defined types were placed in this encounter.  No orders of the defined types were placed in this encounter.   There are no Patient Instructions on file for this visit.   Signed, Meriam Sprague, MD  08/20/2020 11:33 AM    [facility]

## 2020-08-26 ENCOUNTER — Ambulatory Visit: Payer: 59 | Admitting: Cardiology

## 2020-09-04 ENCOUNTER — Encounter: Payer: Self-pay | Admitting: Internal Medicine

## 2020-09-04 ENCOUNTER — Other Ambulatory Visit: Payer: Self-pay

## 2020-09-04 ENCOUNTER — Ambulatory Visit: Payer: 59 | Admitting: Internal Medicine

## 2020-09-04 DIAGNOSIS — H6592 Unspecified nonsuppurative otitis media, left ear: Secondary | ICD-10-CM

## 2020-09-04 DIAGNOSIS — G5711 Meralgia paresthetica, right lower limb: Secondary | ICD-10-CM | POA: Diagnosis not present

## 2020-09-04 DIAGNOSIS — E785 Hyperlipidemia, unspecified: Secondary | ICD-10-CM

## 2020-09-04 MED ORDER — METHYLPREDNISOLONE 4 MG PO TBPK: ORAL_TABLET | ORAL | 0 refills | Status: DC

## 2020-09-04 NOTE — Assessment & Plan Note (Addendum)
New symptoms RLQ/groin -we discussed the nature of this condition.  Use pants with elastic waistband.

## 2020-09-04 NOTE — Assessment & Plan Note (Addendum)
New.  Substantial symptoms.  Will start on Medrol pack Use Afrin x 1 wk Will consult ENT if not better

## 2020-09-04 NOTE — Progress Notes (Signed)
Subjective:  Patient ID: Megan Medina, female    DOB: 1966/01/29  Age: 55 y.o. MRN: [id number]  CC: Follow-up (Want to discuss her cholesterol. Pt states she had cholesterol check back in April at her GYN) and [facility] (Pt states back in Dec she was dx w/ covid. After she states she develop a cough which caused pain/numbness feeling on (R) side)   HPI Megan Medina presents for elevated cholesterol -- T chol 205, TG 112, LDL 142 C/o L ear congestion C/o numbness in RLQ after COVID in Dec 2021 CT - No evidence of coronary artery calcification - score 0  Outpatient Medications Prior to Visit  Medication Sig Dispense Refill   albuterol (VENTOLIN HFA) 108 (90 Base) MCG/ACT inhaler INHALE 2 PUFFS INTO THE LUNGS EVERY 6 HOURS AS NEEDED FOR WHEEZING/SHORTNESS OF BREATH *NEED APPT* 8 g 0   escitalopram (LEXAPRO) 20 MG tablet Take 20 mg by mouth daily.     fluticasone (FLONASE) 50 MCG/ACT nasal spray Place 2 sprays into both nostrils daily. 16 g 6   meclizine (ANTIVERT) 25 MG tablet Take 1 tablet (25 mg total) by mouth 3 (three) times daily as needed for dizziness. 30 tablet 1   ondansetron (ZOFRAN) 4 MG tablet Take 1 tablet (4 mg total) by mouth every 8 (eight) hours as needed for nausea or vomiting. 20 tablet 0   valACYclovir (VALTREX) 500 MG tablet Take 1 tablet by mouth daily.     amoxicillin-clavulanate (AUGMENTIN) 875-125 MG tablet Take 1 tablet by mouth 2 (two) times daily. (Patient not taking: Reported on 09/04/2020) 20 tablet 0   Influenza Vac Recombinant HA (FLUBLOK IM) Inject into the muscle. (Patient not taking: Reported on 09/04/2020)     methocarbamol (ROBAXIN) 500 MG tablet Take 1 tablet (500 mg total) by mouth 4 (four) times daily. (Patient not taking: Reported on 09/04/2020) 45 tablet 1   predniSONE (DELTASONE) 10 MG tablet 3 tabs by mouth per day for 3 days,2tabs per day for 3 days,1tab per day for 3 days (Patient not taking: Reported on 09/04/2020) 18 tablet 0    traMADol (ULTRAM) 50 MG tablet Take by mouth every 6 (six) hours as needed. (Patient not taking: Reported on 09/04/2020)     No facility-administered medications prior to visit.    ROS: Review of Systems  Constitutional:  Negative for activity change, appetite change, chills, fatigue and unexpected weight change.  HENT:  Positive for ear pain and hearing loss. Negative for congestion, mouth sores and sinus pressure.   Eyes:  Negative for visual disturbance.  Respiratory:  Negative for cough and chest tightness.   Gastrointestinal:  Negative for abdominal pain and nausea.  Genitourinary:  Negative for difficulty urinating, frequency and vaginal pain.  Musculoskeletal:  Negative for back pain and gait problem.  Skin:  Negative for pallor and rash.  Neurological:  Negative for dizziness, tremors, weakness, numbness and headaches.  Psychiatric/Behavioral:  Negative for confusion and sleep disturbance.    Objective:  BP 120/84 (BP Location: Left Arm)   Pulse 76   Temp 98.3 F (36.8 C) (Oral)   Ht 5\' 3"  (1.6 m)   Wt 172 lb 3.2 oz (78.1 kg)   SpO2 97%   BMI 30.50 kg/m   BP Readings from Last 3 Encounters:  09/04/20 120/84  03/06/20 (!) 155/88  02/09/18 114/78    Wt Readings from Last 3 Encounters:  09/04/20 172 lb 3.2 oz (78.1 kg)  03/06/20 176 lb (79.8 kg)  02/09/18  173 lb 0.6 oz (78.5 kg)    Physical Exam Constitutional:      General: She is not in acute distress.    Appearance: She is well-developed.  HENT:     Head: Normocephalic.     Right Ear: External ear normal.     Left Ear: External ear normal.     Nose: Nose normal.  Eyes:     General:        Right eye: No discharge.        Left eye: No discharge.     Conjunctiva/sclera: Conjunctivae normal.     Pupils: Pupils are equal, round, and reactive to light.  Neck:     Thyroid: No thyromegaly.     Vascular: No JVD.     Trachea: No tracheal deviation.  Cardiovascular:     Rate and Rhythm: Normal rate and regular  rhythm.     Heart sounds: Normal heart sounds.  Pulmonary:     Effort: No respiratory distress.     Breath sounds: No stridor. No wheezing.  Abdominal:     General: Bowel sounds are normal. There is no distension.     Palpations: Abdomen is soft. There is no mass.     Tenderness: There is no abdominal tenderness. There is no guarding or rebound.  Musculoskeletal:        General: No tenderness.     Cervical back: Normal range of motion and neck supple. No rigidity.  Lymphadenopathy:     Cervical: No cervical adenopathy.  Skin:    Findings: No erythema or rash.  Neurological:     Mental Status: She is oriented to person, place, and time.     Cranial Nerves: No cranial nerve deficit.     Motor: No abnormal muscle tone.     Coordination: Coordination normal.     Deep Tendon Reflexes: Reflexes normal.  Psychiatric:        Behavior: Behavior normal.        Thought Content: Thought content normal.        Judgment: Judgment normal.  Oval-shaped area of decreased sensory in the right inguinal/right lower quadrant measuring 20 x 10 cm  Lab Results  Component Value Date   WBC 17.8 (H) 01/16/2017   HGB 13.5 01/16/2017   HCT 40.2 01/16/2017   PLT 326 01/16/2017   GLUCOSE 100 (H) 01/17/2017   ALT 16 01/15/2017   AST 15 01/15/2017   NA 137 01/17/2017   K 3.9 01/17/2017   CL 106 01/17/2017   CREATININE 0.65 01/17/2017   BUN 6 01/17/2017   CO2 24 01/17/2017   TSH 1.37 09/23/2010    CT CARDIAC SCORING ([facility] LOCATIONS ONLY)  Result Date: 07/11/2020 CLINICAL DATA:  55 year old white female with elevated cholesterol in family history of heart disease. EXAM: CT CARDIAC CORONARY ARTERY CALCIUM SCORE TECHNIQUE: Non-contrast imaging through the heart was performed using prospective ECG gating. Image post processing was performed on an independent workstation, allowing for quantitative analysis of the heart and coronary arteries. Note that this exam targets the heart and the chest was not imaged  in its entirety. COMPARISON:  None. FINDINGS: CORONARY CALCIUM SCORES: Left Main: 0 LAD: 0 LCx: 0 RCA: 0 Total Agatston Score: 0 MESA database percentile: 0 AORTA MEASUREMENTS: Ascending Aorta: 30 mm Descending Aorta: 18 mm OTHER FINDINGS: Cardiovascular: No visible aortic atherosclerosis. Normal heart size without pericardial effusion. Normal caliber central pulmonary vessels. Mediastinum/Nodes: No mediastinal adenopathy in the visualized mediastinum. Esophagus grossly normal. Limited imaging  of the chest on today's study covering only the heart in mid chest. Lungs/Pleura: Lungs are clear within the visualized chest. Visualized airways are patent. Minimal subpleural reticulation present bilaterally. Upper Abdomen: Incidental imaging of the upper most abdomen is unremarkable. Musculoskeletal: No acute musculoskeletal process on very limited assessment. IMPRESSION: 1. No evidence of coronary artery calcification. Electronically Signed   By: Donzetta Kohut M.D.   On: 07/11/2020 14:08    Assessment & Plan:     Sonda Primes, MD

## 2020-09-04 NOTE — Assessment & Plan Note (Addendum)
We can discontinue ASA Fish oil

## 2020-12-17 ENCOUNTER — Telehealth: Payer: Self-pay | Admitting: Internal Medicine

## 2020-12-17 NOTE — Telephone Encounter (Signed)
Patient says she is taking her first ever flight on a plane 10.02.22 & is extremely nervous  Wants to know if provider is willing to send over something to pharmacy to help her be comfortable on the plane  Says provider gave her something to help her when she took her first cruise a while ago & was hoping he would do the same again  Pharmacy:  [facility] # [address] [place] - [address]  Phone:  [phone] Fax:  [phone]

## 2020-12-18 MED ORDER — LORAZEPAM 0.5 MG PO TABS: ORAL_TABLET | ORAL | 0 refills | Status: DC

## 2020-12-18 NOTE — Telephone Encounter (Signed)
Ok Lorazepam prn Thx

## 2020-12-18 NOTE — Telephone Encounter (Signed)
Notified pt w/ MD response. Pt states she also need something for motion sickness as well while she is on the cruise...Megan Medina

## 2020-12-19 ENCOUNTER — Telehealth: Payer: Self-pay | Admitting: Internal Medicine

## 2020-12-19 MED ORDER — ONDANSETRON HCL 4 MG PO TABS: 4.0000 mg | ORAL_TABLET | Freq: Three times a day (TID) | ORAL | 0 refills | Status: DC | PRN

## 2020-12-19 NOTE — Telephone Encounter (Signed)
Please send rx to correct pharmacy: LORazepam (ATIVAN) 0.5 MG tablet ondansetron (ZOFRAN) 4 MG tablet  Patient is going on cruise that leaves Saturday & needs to pick up from pharmacy tomorrow  Pharmacy: [facility] # [address], [place] - [address]  Phone:  [phone] Fax:  [phone]

## 2020-12-19 NOTE — Telephone Encounter (Signed)
Rx for LORazepam (ATIVAN) 0.5 MG tablet needs to be sent to [facility] # [address] [place] - [address]       Phone:             [phone] Fax:     [phone]   It was originally sent to [facility]

## 2020-12-19 NOTE — Telephone Encounter (Signed)
Lorazepam already sent. Refilled rx for zofran.Marland KitchenRaechel Chute

## 2020-12-20 MED ORDER — LORAZEPAM 0.5 MG PO TABS: ORAL_TABLET | ORAL | 0 refills | Status: DC

## 2020-12-20 MED ORDER — SCOPOLAMINE 1 MG/3DAYS TD PT72: 1.0000 | MEDICATED_PATCH | TRANSDERMAL | 0 refills | Status: DC

## 2020-12-20 NOTE — Telephone Encounter (Signed)
OK scopolamine patch. In the future please schedule an appointment to address problems like these. Thx

## 2020-12-20 NOTE — Telephone Encounter (Signed)
Pt is wanting rx's sent to [facility] instead of [facility]. Rsent to [facility]../lmb

## 2020-12-22 MED ORDER — LORAZEPAM 0.5 MG PO TABS: ORAL_TABLET | ORAL | 0 refills | Status: DC

## 2020-12-22 MED ORDER — ONDANSETRON HCL 4 MG PO TABS
4.0000 mg | ORAL_TABLET | Freq: Three times a day (TID) | ORAL | 0 refills | Status: AC | PRN
Start: 2020-12-22 — End: ?

## 2020-12-22 MED ORDER — SCOPOLAMINE 1 MG/3DAYS TD PT72: 1.0000 | MEDICATED_PATCH | TRANSDERMAL | 0 refills | Status: DC

## 2020-12-22 NOTE — Addendum Note (Signed)
Addended by: Tresa Garter on: 12/22/2020 05:23 PM   Modules accepted: Orders

## 2020-12-22 NOTE — Telephone Encounter (Signed)
Okay.  Thanks.

## 2020-12-23 ENCOUNTER — Telehealth: Payer: Self-pay

## 2020-12-23 NOTE — Telephone Encounter (Signed)
NOTES SCANNED TO REFERRAL RJ 

## 2021-06-06 ENCOUNTER — Encounter: Payer: Self-pay | Admitting: Family Medicine

## 2021-06-06 ENCOUNTER — Other Ambulatory Visit: Payer: Self-pay

## 2021-06-06 ENCOUNTER — Ambulatory Visit (INDEPENDENT_AMBULATORY_CARE_PROVIDER_SITE_OTHER): Payer: Managed Care, Other (non HMO) | Admitting: Family Medicine

## 2021-06-06 ENCOUNTER — Telehealth: Payer: Self-pay | Admitting: Internal Medicine

## 2021-06-06 VITALS — BP 116/82 | HR 90 | Temp 98.0°F | Ht 63.0 in | Wt 179.4 lb

## 2021-06-06 DIAGNOSIS — A084 Viral intestinal infection, unspecified: Secondary | ICD-10-CM

## 2021-06-06 DIAGNOSIS — R197 Diarrhea, unspecified: Secondary | ICD-10-CM | POA: Diagnosis not present

## 2021-06-06 DIAGNOSIS — R42 Dizziness and giddiness: Secondary | ICD-10-CM | POA: Diagnosis not present

## 2021-06-06 DIAGNOSIS — Z1211 Encounter for screening for malignant neoplasm of colon: Secondary | ICD-10-CM

## 2021-06-06 NOTE — Progress Notes (Signed)
? ?Subjective:  ? ? Patient ID: Megan Medina, female    DOB: November 05, 1965, 56 y.o.   MRN: [id number] ? ?HPI ?Chief Complaint  ?Patient presents with  ? Diarrhea  ?  Watery stools since 3/14, does have hx of c-diff. Started out with vertigo on Tuesday then diarrhea started midday.  ?Took imodium Wednesday and this morning, slowed it down some but comes back.  ? ?She is here with complaints of diarrhea. Watery diarrhea started 3 days ago. Denies blood or pus in stool. Denies fever, chills, chest pain, palpitations, abdominal pain, N/V/D or urinary symptoms. She does report decreased appetite.  ?History of C-diff 4 years ago per patient. States it was from taking clindamycin.  ?She is concerned she could have C-diff again.  ?States Imodium helps slow down her episodes. She has only had 1 episode of diarrhea today since taking Imodium this morning.  ? ?Denies antibiotics in the past 6 months. No travel out of the country.  ? ?States her last colonoscopy was more than 10 years ago and she is ready to schedule another one. Requests referral to GI.  ? ?Reports having episodes of dizziness that is consistent with her vertigo history. She is taking meclizine which does help.  ? ?Past Medical History:  ?Diagnosis Date  ? Allergic rhinitis   ? Asthma   ? Elevated LDL cholesterol level   ? Family history of ischemic heart disease and other diseases of the circulatory system   ? IBS (irritable bowel syndrome)   ? Pancolitis (HCC)   ? ?Current Outpatient Medications on File Prior to Visit  ?Medication Sig Dispense Refill  ? albuterol (VENTOLIN HFA) 108 (90 Base) MCG/ACT inhaler INHALE 2 PUFFS INTO THE LUNGS EVERY 6 HOURS AS NEEDED FOR WHEEZING/SHORTNESS OF BREATH *NEED APPT* 8 g 0  ? escitalopram (LEXAPRO) 20 MG tablet Take 20 mg by mouth daily.    ? fluticasone (FLONASE) 50 MCG/ACT nasal spray Place 2 sprays into both nostrils daily. 16 g 6  ? LORazepam (ATIVAN) 0.5 MG tablet Take 1-2 tablets 1 hour prior to air travel.   Can repeat in 6 hours if needed.  Use as needed 20 tablet 0  ? meclizine (ANTIVERT) 25 MG tablet Take 1 tablet (25 mg total) by mouth 3 (three) times daily as needed for dizziness. 30 tablet 1  ? methylPREDNISolone (MEDROL DOSEPAK) 4 MG TBPK tablet As directed 21 tablet 0  ? ondansetron (ZOFRAN) 4 MG tablet Take 1 tablet (4 mg total) by mouth every 8 (eight) hours as needed for nausea or vomiting. 20 tablet 0  ? scopolamine (TRANSDERM-SCOP, 1.5 MG,) 1 MG/3DAYS Place 1 patch (1.5 mg total) onto the skin every 3 (three) days. 4 patch 0  ? valACYclovir (VALTREX) 500 MG tablet Take 1 tablet by mouth daily.    ? ?No current facility-administered medications on file prior to visit.  ? ? ? ?Reviewed allergies, medications, past medical, surgical, family, and social history. ? ? ?Review of Systems ?Pertinent positives and negatives in the history of present illness. ? ?   ?Objective:  ? Physical Exam ?Constitutional:   ?   General: She is not in acute distress. ?   Appearance: Normal appearance. She is not ill-appearing.  ?HENT:  ?   Right Ear: Tympanic membrane and ear canal normal.  ?   Left Ear: Tympanic membrane and ear canal normal.  ?Eyes:  ?   Extraocular Movements: Extraocular movements intact.  ?   Conjunctiva/sclera: Conjunctivae normal.  ?  Pupils: Pupils are equal, round, and reactive to light.  ?Cardiovascular:  ?   Rate and Rhythm: Normal rate and regular rhythm.  ?   Pulses: Normal pulses.  ?   Heart sounds: Normal heart sounds.  ?Pulmonary:  ?   Effort: Pulmonary effort is normal.  ?   Breath sounds: Normal breath sounds.  ?Abdominal:  ?   General: Abdomen is flat. Bowel sounds are normal. There is no distension.  ?   Palpations: Abdomen is soft.  ?   Tenderness: There is no abdominal tenderness. There is no guarding or rebound. Negative signs include Murphy's sign, Rovsing's sign and McBurney's sign.  ?Musculoskeletal:  ?   Cervical back: Normal range of motion and neck supple.  ?   Right lower leg: No  edema.  ?   Left lower leg: No edema.  ?Lymphadenopathy:  ?   Cervical: No cervical adenopathy.  ?Skin: ?   General: Skin is warm and dry.  ?Neurological:  ?   Mental Status: She is alert and oriented to person, place, and time.  ?   Cranial Nerves: Cranial nerves 2-12 are intact. No facial asymmetry.  ?   Sensory: Sensation is intact.  ?   Motor: Motor function is intact. No weakness or pronator drift.  ?   Coordination: Coordination is intact.  ?   Gait: Gait is intact.  ? ?BP 116/82 (BP Location: Left Arm, Patient Position: Sitting, Cuff Size: Large)   Pulse 90   Temp 98 ?F (36.7 ?C) (Oral)   Ht 5\' 3"  (1.6 m)   Wt 179 lb 6.4 oz (81.4 kg)   SpO2 95%   BMI 31.78 kg/m?  ? ? ?   ?Assessment & Plan:  ?Viral gastroenteritis ?-No red flag symptoms.  Exam is benign. discussed that viral gastroenteritis is generally self-limiting.  Recommend staying well-hydrated and getting good nutrition.  She will take Imodium as needed and it does appear to be effective. ?We did talk about the brat diet.  Advised her to follow-up if diarrhea does not improve by next week or if she develops fever, bloody stools or any other worrisome symptoms.  Discussed stool studies as an option if the diarrhea does not resolve in approximately 1 week. ? ? ?Diarrhea, unspecified type ?-See above note ? ?Screen for colon cancer - Plan: Ambulatory referral to Gastroenterology ?-Referral made per patient request.  Reportedly is overdue for colonoscopy. ? ?Vertigo ?-normal neuro exam. She will continue taking meclizine as needed.  Discussed options such as vestibular rehab if this is a chronic ongoing problem for her. ? ? ?

## 2021-06-06 NOTE — Telephone Encounter (Signed)
Pt states she is

## 2021-06-06 NOTE — Telephone Encounter (Signed)
No note needed 

## 2021-06-06 NOTE — Patient Instructions (Addendum)
You may continue Imodium as needed.  ?Try to increase fluids and nutrition.  ?Take the Zofran if needed for nausea.  ? ?If you develop fever, blood stools or any worrisome symptoms, follow up with [place] or the emergency department if after office hours.  ? ?Follow up next week if you continue having diarrhea.  ? ?You will hear from [facility] to schedule screening colonoscopy  ? ?Diarrhea, Adult ?Diarrhea is frequent loose and watery bowel movements. Diarrhea can make you feel weak and cause you to become dehydrated. Dehydration can make you tired and thirsty, cause you to have a dry mouth, and decrease how often you urinate. ?Diarrhea typically lasts 2-3 days. However, it can last longer if it is a sign of something more serious. It is important to treat your diarrhea as told by your health care provider. ?Follow these instructions at home: ?Eating and drinking ?  ?Follow these recommendations as told by your health care provider: ?Take an oral rehydration solution (ORS). This is an over-the-counter medicine that helps return your body to its normal balance of nutrients and water. It is found at pharmacies and retail stores. ?Drink plenty of fluids, such as water, ice chips, diluted fruit juice, and low-calorie sports drinks. You can drink milk also, if desired. ?Avoid drinking fluids that contain a lot of sugar or caffeine, such as energy drinks, sports drinks, and soda. ?Eat bland, easy-to-digest foods in small amounts as you are able. These foods include bananas, applesauce, rice, lean meats, toast, and crackers. ?Avoid alcohol. ?Avoid spicy or fatty foods. ? ?Medicines ?Take over-the-counter and prescription medicines only as told by your health care provider. ?If you were prescribed an antibiotic medicine, take it as told by your health care provider. Do not stop using the antibiotic even if you start to feel better. ?General instructions ? ?Wash your hands often using soap and water. If soap and water are not  available, use a hand sanitizer. Others in the household should wash their hands as well. Hands should be washed: ?After using the toilet or changing a diaper. ?Before preparing, cooking, or serving food. ?While caring for a sick person or while visiting someone in a hospital. ?Drink enough fluid to keep your urine pale yellow. ?Rest at home while you recover. ?Watch your condition for any changes. ?Take a warm bath to relieve any burning or pain from frequent diarrhea episodes. ?Keep all follow-up visits as told by your health care provider. This is important. ?Contact a health care provider if: ?You have a fever. ?Your diarrhea gets worse. ?You have new symptoms. ?You cannot keep fluids down. ?You feel light-headed or dizzy. ?You have a headache. ?You have muscle cramps. ?Get help right away if: ?You have chest pain. ?You feel extremely weak or you faint. ?You have bloody or black stools or stools that look like tar. ?You have severe pain, cramping, or bloating in your abdomen. ?You have trouble breathing or you are breathing very quickly. ?Your heart is beating very quickly. ?Your skin feels cold and clammy. ?You feel confused. ?You have signs of dehydration, such as: ?Dark urine, very little urine, or no urine. ?Cracked lips. ?Dry mouth. ?Sunken eyes. ?Sleepiness. ?Weakness. ?Summary ?Diarrhea is frequent loose and sometimes watery bowel movements. Diarrhea can make you feel weak and cause you to become dehydrated. ?Drink enough fluids to keep your urine pale yellow. ?Make sure that you wash your hands after using the toilet. If soap and water are not available, use hand sanitizer. ?Contact  a health care provider if your diarrhea gets worse or you have new symptoms. ?Get help right away if you have signs of dehydration. ?This information is not intended to replace advice given to you by your health care provider. Make sure you discuss any questions you have with your health care provider. ?Document Revised:  09/18/2020 Document Reviewed: 09/18/2020 ?Elsevier Patient Education ? 2022 [employer] ? ?

## 2021-08-29 ENCOUNTER — Ambulatory Visit (INDEPENDENT_AMBULATORY_CARE_PROVIDER_SITE_OTHER): Payer: Managed Care, Other (non HMO) | Admitting: Internal Medicine

## 2021-08-29 ENCOUNTER — Encounter: Payer: Self-pay | Admitting: Internal Medicine

## 2021-08-29 ENCOUNTER — Telehealth: Payer: Self-pay | Admitting: *Deleted

## 2021-08-29 VITALS — BP 122/72 | HR 75 | Temp 98.1°F | Ht 63.0 in | Wt 184.0 lb

## 2021-08-29 DIAGNOSIS — F419 Anxiety disorder, unspecified: Secondary | ICD-10-CM | POA: Diagnosis not present

## 2021-08-29 DIAGNOSIS — R051 Acute cough: Secondary | ICD-10-CM | POA: Diagnosis not present

## 2021-08-29 DIAGNOSIS — B009 Herpesviral infection, unspecified: Secondary | ICD-10-CM | POA: Insufficient documentation

## 2021-08-29 DIAGNOSIS — J45901 Unspecified asthma with (acute) exacerbation: Secondary | ICD-10-CM | POA: Diagnosis not present

## 2021-08-29 DIAGNOSIS — Z8742 Personal history of other diseases of the female genital tract: Secondary | ICD-10-CM | POA: Insufficient documentation

## 2021-08-29 MED ORDER — PREDNISONE 10 MG PO TABS: ORAL_TABLET | ORAL | 0 refills | Status: DC

## 2021-08-29 MED ORDER — AMOXICILLIN-POT CLAVULANATE 875-125 MG PO TABS: 1.0000 | ORAL_TABLET | Freq: Two times a day (BID) | ORAL | 0 refills | Status: DC

## 2021-08-29 MED ORDER — ALBUTEROL SULFATE HFA 108 (90 BASE) MCG/ACT IN AERS: INHALATION_SPRAY | RESPIRATORY_TRACT | 3 refills | Status: DC

## 2021-08-29 MED ORDER — HYDROCODONE BIT-HOMATROP MBR 5-1.5 MG/5ML PO SOLN: 5.0000 mL | Freq: Four times a day (QID) | ORAL | 0 refills | Status: AC | PRN

## 2021-08-29 NOTE — Patient Instructions (Signed)
Please take all new medication as prescribed - the antibiotic, cough medicine, and prednisone  Please continue all other medications as before, and refill done for the inhaler as needed  Please have the pharmacy call with any other refills you may need.  Please keep your appointments with your specialists as you may have planned

## 2021-08-29 NOTE — Telephone Encounter (Signed)
But hydrocodone is completely different from codeine, so should be ok   this is a competely different medication than codeine   thanks

## 2021-08-29 NOTE — Progress Notes (Unsigned)
Patient ID: Megan Medina, female   DOB: [birth year], 56 y.o.   MRN: [id number]        Chief Complaint: follow up cough and wheezing       HPI:  Megan Medina is a 56 y.o. female Here with acute onset mild to mod 9 days ST, HA, general weakness and malaise, with prod cough sputum, but Pt denies chest pain, increased sob or doe, orthopnea, PND, increased LE swelling, palpitations, dizziness or syncope, except onset mild wheezing and sob/doe in the last 2 days.   Pt denies polydipsia, polyuria, or new focal neuro s/s.    Pt denies recent wt loss, night sweats, loss of appetite, or other constitutional symptoms .  Denies worsening depressive symptoms, suicidal ideation, or panic; has ongoing anxiety, overall stable recently.   Wt Readings from Last 3 Encounters:  08/29/21 184 lb (83.5 kg)  06/06/21 179 lb 6.4 oz (81.4 kg)  09/04/20 172 lb 3.2 oz (78.1 kg)   BP Readings from Last 3 Encounters:  08/29/21 122/72  06/06/21 116/82  09/04/20 120/84         Past Medical History:  Diagnosis Date   Allergic rhinitis    Asthma    Elevated LDL cholesterol level    Family history of ischemic heart disease and other diseases of the circulatory system    IBS (irritable bowel syndrome)    Pancolitis (HCC)    Past Surgical History:  Procedure Laterality Date   PARTIAL HYSTERECTOMY     vaginal   TONSILLECTOMY      reports that she has never smoked. She has never used smokeless tobacco. She reports that she does not drink alcohol and does not use drugs. family history includes Asthma in her mother; Diverticulosis in her mother; Heart disease in her father; Hypertension in her father. Allergies  Allergen Reactions   Clindamycin/Lincomycin     C. diff   Codeine Phosphate     REACTION: unspecified   Hydrocodone Nausea And Vomiting   Methylprednisolone     A local reaction to DepoMedrol   Current Outpatient Medications on File Prior to Visit  Medication Sig Dispense Refill    escitalopram (LEXAPRO) 20 MG tablet Take 20 mg by mouth daily.     fluticasone (FLONASE) 50 MCG/ACT nasal spray Place 2 sprays into both nostrils daily. 16 g 6   LORazepam (ATIVAN) 0.5 MG tablet Take 1-2 tablets 1 hour prior to air travel.  Can repeat in 6 hours if needed.  Use as needed 20 tablet 0   meclizine (ANTIVERT) 25 MG tablet Take 1 tablet (25 mg total) by mouth 3 (three) times daily as needed for dizziness. 30 tablet 1   methylPREDNISolone (MEDROL DOSEPAK) 4 MG TBPK tablet As directed 21 tablet 0   ondansetron (ZOFRAN) 4 MG tablet Take 1 tablet (4 mg total) by mouth every 8 (eight) hours as needed for nausea or vomiting. 20 tablet 0   scopolamine (TRANSDERM-SCOP, 1.5 MG,) 1 MG/3DAYS Place 1 patch (1.5 mg total) onto the skin every 3 (three) days. 4 patch 0   valACYclovir (VALTREX) 500 MG tablet Take 1 tablet by mouth daily.     No current facility-administered medications on file prior to visit.        ROS:  All others reviewed and negative.  Objective        PE:  BP 122/72 (BP Location: Right Arm, Patient Position: Sitting, Cuff Size: Large)   Pulse 75   Temp 98.1 F (36.7  C) (Oral)   Ht 5\' 3"  (1.6 m)   Wt 184 lb (83.5 kg)   SpO2 97%   BMI 32.59 kg/m                 Constitutional: Pt appears in NAD               HENT: Head: NCAT.                Right Ear: External ear normal.                 Left Ear: External ear normal. Bilat tm's with mild erythema.  Max sinus areas mild tender.  Pharynx with mild erythema, no exudate               Eyes: . Pupils are equal, round, and reactive to light. Conjunctivae and EOM are normal               Nose: without d/c or deformity               Neck: Neck supple. Gross normal ROM               Cardiovascular: Normal rate and regular rhythm.                 Pulmonary/Chest: Effort normal and breath sounds without rales but with few mild wheezing.                               Neurological: Pt is alert. At baseline orientation, motor  grossly intact               Skin: Skin is warm. No rashes, no other new lesions, LE edema - none               Psychiatric: Pt behavior is normal without agitation   Micro: none  Cardiac tracings I have personally interpreted today:  none  Pertinent Radiological findings (summarize): none   Lab Results  Component Value Date   WBC 17.8 (H) 01/16/2017   HGB 13.5 01/16/2017   HCT 40.2 01/16/2017   PLT 326 01/16/2017   GLUCOSE 100 (H) 01/17/2017   ALT 16 01/15/2017   AST 15 01/15/2017   NA 137 01/17/2017   K 3.9 01/17/2017   CL 106 01/17/2017   CREATININE 0.65 01/17/2017   BUN 6 01/17/2017   CO2 24 01/17/2017   TSH 1.37 09/23/2010   Assessment/Plan:  Megan Medina is a 56 y.o. White or Caucasian [1] female with  has a past medical history of Allergic rhinitis, Asthma, Elevated LDL cholesterol level, Family history of ischemic heart disease and other diseases of the circulatory system, IBS (irritable bowel syndrome), and Pancolitis (HCC).  Asthma With mild exacerbation  - for prednisone taper asd, albuterol hfa prn,  to f/u any worsening symptoms or concerns  Coughing Mild to mod, for augmentin course, cough med prn,  to f/u any worsening symptoms or concerns  Anxiety Stable overall , continue lexapro 20 mg qd  Followup: Return if symptoms worsen or fail to improve.  56, MD 08/31/2021 11:40 AM [facility]

## 2021-08-29 NOTE — Telephone Encounter (Signed)
Patient states she is allergic to codeine and she cant take the Hydrocodone cough syrup. Patient requesting something else for her cough

## 2021-08-31 ENCOUNTER — Encounter: Payer: Self-pay | Admitting: Internal Medicine

## 2021-08-31 NOTE — Assessment & Plan Note (Signed)
Stable overall , continue lexapro 20 mg qd

## 2021-08-31 NOTE — Assessment & Plan Note (Addendum)
Mild to mod, for augmentin course, cough med prn,  to f/u any worsening symptoms or concerns

## 2021-08-31 NOTE — Assessment & Plan Note (Addendum)
With mild exacerbation  - for prednisone taper asd, albuterol hfa prn,  to f/u any worsening symptoms or concerns

## 2021-09-02 MED ORDER — GUAIFENESIN-DM 100-10 MG/5ML PO SYRP: 5.0000 mL | ORAL_SOLUTION | ORAL | 1 refills | Status: DC | PRN

## 2021-09-02 NOTE — Telephone Encounter (Signed)
Called to update patient with your response and she states that she has tried the hydrocodone and has stopped taking it because it caused an upset stomach.

## 2021-09-02 NOTE — Addendum Note (Signed)
Addended by: Biagio Borg on: 09/02/2021 03:47 PM   Modules accepted: Orders

## 2021-09-02 NOTE — Telephone Encounter (Signed)
Ok I changed to robitussin DM

## 2022-03-18 ENCOUNTER — Telehealth: Payer: Self-pay

## 2022-03-18 ENCOUNTER — Ambulatory Visit
Admission: RE | Admit: 2022-03-18 | Discharge: 2022-03-18 | Disposition: A | Discharge status: Home / Self Care | Payer: Managed Care, Other (non HMO) | Source: Ambulatory Visit | Attending: Urgent Care | Admitting: Urgent Care

## 2022-03-18 VITALS — BP 124/80 | HR 80 | Temp 99.1°F | Resp 20

## 2022-03-18 DIAGNOSIS — U071 COVID-19: Secondary | ICD-10-CM

## 2022-03-18 DIAGNOSIS — J4530 Mild persistent asthma, uncomplicated: Secondary | ICD-10-CM

## 2022-03-18 MED ORDER — PAXLOVID (300/100) 20 X 150 MG & 10 X 100MG PO TBPK: ORAL_TABLET | ORAL | 0 refills | Status: DC

## 2022-03-18 MED ORDER — PROMETHAZINE-DM 6.25-15 MG/5ML PO SYRP: 2.5000 mL | ORAL_SOLUTION | Freq: Three times a day (TID) | ORAL | 0 refills | Status: DC | PRN

## 2022-03-18 MED ORDER — BENZONATATE 100 MG PO CAPS: 100.0000 mg | ORAL_CAPSULE | Freq: Three times a day (TID) | ORAL | 0 refills | Status: DC | PRN

## 2022-03-18 NOTE — Telephone Encounter (Signed)
Pt called states [facility] does not have paxovid-requests to be sent to [facility]-done

## 2022-03-18 NOTE — ED Triage Notes (Signed)
Pt c/o flu like sx stared 12/24-test +covid test 12/25-NAD-steady gait

## 2022-03-18 NOTE — ED Provider Notes (Signed)
[facility]  Note:  This document was prepared using Conservation officer, historic buildings and may include unintentional dictation errors.  MRN: [id number] DOB: 02-Mar-1966  Subjective:   Megan Medina is a 56 y.o. female presenting for 3 to 4-day history of acute onset persistent malaise, fatigue, body aches, runny and stuffy nose.  Tested positive for COVID-19 03/16/2022.  Has a history of allergic rhinitis and asthma.  No current facility-administered medications for this encounter.  Current Outpatient Medications:    albuterol (VENTOLIN HFA) 108 (90 Base) MCG/ACT inhaler, INHALE 2 PUFFS INTO THE LUNGS EVERY 6 HOURS AS NEEDED FOR WHEEZING/SHORTNESS OF BREATH, Disp: 8 g, Rfl: 3   escitalopram (LEXAPRO) 20 MG tablet, Take 20 mg by mouth daily., Disp: , Rfl:    meclizine (ANTIVERT) 25 MG tablet, Take 1 tablet (25 mg total) by mouth 3 (three) times daily as needed for dizziness., Disp: 30 tablet, Rfl: 1   ondansetron (ZOFRAN) 4 MG tablet, Take 1 tablet (4 mg total) by mouth every 8 (eight) hours as needed for nausea or vomiting., Disp: 20 tablet, Rfl: 0   valACYclovir (VALTREX) 500 MG tablet, Take 1 tablet by mouth daily., Disp: , Rfl:    Allergies  Allergen Reactions   Clindamycin/Lincomycin     C. diff   Codeine Phosphate     REACTION: unspecified   Hydrocodone Nausea And Vomiting   Methylprednisolone     A local reaction to DepoMedrol    Past Medical History:  Diagnosis Date   Allergic rhinitis    Asthma    Elevated LDL cholesterol level    Family history of ischemic heart disease and other diseases of the circulatory system    IBS (irritable bowel syndrome)    Pancolitis (HCC)      Past Surgical History:  Procedure Laterality Date   PARTIAL HYSTERECTOMY     vaginal   TONSILLECTOMY      Family History  Problem Relation Age of Onset   Diverticulosis Mother    Asthma Mother    Hypertension Father    Heart disease Father     Inflammatory bowel disease Neg Hx     Social History   Tobacco Use   Smoking status: Never   Smokeless tobacco: Never  Vaping Use   Vaping Use: Never used  Substance Use Topics   Alcohol use: No   Drug use: No    ROS   Objective:   Vitals: BP 124/80 (BP Location: Right Arm)   Pulse 80   Temp 99.1 F (37.3 C) (Oral)   Resp 20   SpO2 95%   Physical Exam Constitutional:      General: She is not in acute distress.    Appearance: Normal appearance. She is well-developed and normal weight. She is not ill-appearing, toxic-appearing or diaphoretic.  HENT:     Head: Normocephalic and atraumatic.     Right Ear: Tympanic membrane, ear canal and external ear normal. No drainage or tenderness. No middle ear effusion. There is no impacted cerumen. Tympanic membrane is not erythematous or bulging.     Left Ear: Tympanic membrane, ear canal and external ear normal. No drainage or tenderness.  No middle ear effusion. There is no impacted cerumen. Tympanic membrane is not erythematous or bulging.     Nose: Congestion present. No rhinorrhea.     Mouth/Throat:     Mouth: Mucous membranes are moist. No oral lesions.     Pharynx: No pharyngeal swelling, oropharyngeal exudate,  posterior oropharyngeal erythema or uvula swelling.     Tonsils: No tonsillar exudate or tonsillar abscesses.  Eyes:     General: No scleral icterus.       Right eye: No discharge.        Left eye: No discharge.     Extraocular Movements: Extraocular movements intact.     Right eye: Normal extraocular motion.     Left eye: Normal extraocular motion.     Conjunctiva/sclera: Conjunctivae normal.  Cardiovascular:     Rate and Rhythm: Normal rate and regular rhythm.     Heart sounds: Normal heart sounds. No murmur heard.    No friction rub. No gallop.  Pulmonary:     Effort: Pulmonary effort is normal. No respiratory distress.     Breath sounds: No stridor. No wheezing, rhonchi or rales.  Chest:     Chest wall: No  tenderness.  Musculoskeletal:     Cervical back: Normal range of motion and neck supple.  Lymphadenopathy:     Cervical: No cervical adenopathy.  Skin:    General: Skin is warm and dry.  Neurological:     General: No focal deficit present.     Mental Status: She is alert and oriented to person, place, and time.  Psychiatric:        Mood and Affect: Mood normal.        Behavior: Behavior normal.     Assessment and Plan :   PDMP not reviewed this encounter.  1. COVID-19 virus infection     Deferred imaging given clear cardiopulmonary exam, hemodynamically stable vital signs.  Recommended empiric treatment with Paxlovid.  Will defer steroid use for now given clear pulmonary exam and overall reassuring physical exam findings.  COVID confirmation testing pending. Counseled patient on potential for adverse effects with medications prescribed/recommended today, ER and return-to-clinic precautions discussed, patient verbalized understanding.    Wallis Bamberg, [place] 03/18/22 [id number]

## 2022-03-19 LAB — SARS CORONAVIRUS 2 (TAT 6-24 HRS): SARS Coronavirus 2: POSITIVE — AB

## 2022-10-08 ENCOUNTER — Other Ambulatory Visit: Payer: Self-pay | Admitting: Internal Medicine

## 2022-10-08 DIAGNOSIS — J45901 Unspecified asthma with (acute) exacerbation: Secondary | ICD-10-CM

## 2023-02-24 ENCOUNTER — Other Ambulatory Visit: Payer: Self-pay | Admitting: Internal Medicine

## 2023-02-24 DIAGNOSIS — J45901 Unspecified asthma with (acute) exacerbation: Secondary | ICD-10-CM

## 2023-02-25 ENCOUNTER — Other Ambulatory Visit: Payer: Self-pay

## 2023-03-22 ENCOUNTER — Ambulatory Visit (INDEPENDENT_AMBULATORY_CARE_PROVIDER_SITE_OTHER): Payer: Managed Care, Other (non HMO) | Admitting: Family Medicine

## 2023-03-22 ENCOUNTER — Encounter: Payer: Self-pay | Admitting: Family Medicine

## 2023-03-22 VITALS — BP 124/78 | HR 78 | Temp 98.6°F | Ht 63.0 in | Wt 187.0 lb

## 2023-03-22 DIAGNOSIS — J069 Acute upper respiratory infection, unspecified: Secondary | ICD-10-CM

## 2023-03-22 DIAGNOSIS — B9689 Other specified bacterial agents as the cause of diseases classified elsewhere: Secondary | ICD-10-CM | POA: Diagnosis not present

## 2023-03-22 MED ORDER — BENZONATATE 200 MG PO CAPS: 200.0000 mg | ORAL_CAPSULE | Freq: Three times a day (TID) | ORAL | 0 refills | Status: AC | PRN

## 2023-03-22 MED ORDER — AMOXICILLIN-POT CLAVULANATE 875-125 MG PO TABS: 1.0000 | ORAL_TABLET | Freq: Two times a day (BID) | ORAL | 0 refills | Status: AC

## 2023-03-22 NOTE — Progress Notes (Signed)
Assessment & Plan:  1. Bacterial upper respiratory infection (Primary) Education provided on upper respiratory infections.  Encouraged symptom management. - benzonatate (TESSALON) 200 MG capsule; Take 1 capsule (200 mg total) by mouth 3 (three) times daily as needed for cough.  Dispense: 30 capsule; Refill: 0 - amoxicillin-clavulanate (AUGMENTIN) 875-125 MG tablet; Take 1 tablet by mouth 2 (two) times daily for 7 days.  Dispense: 14 tablet; Refill: 0  No results found for any visits on 03/22/23.  Follow up plan: Return if symptoms worsen or fail to improve.  Deliah Boston, MSN, APRN, FNP-C  Subjective:  HPI: Megan Medina is a 57 y.o. female presenting on 03/22/2023 for Cough (With congestion - green in color, sinus pressure and congestion, R >L ear pain, no fever - this started with a ST on 03/17/23.)  Patient complains of head congestion, sore throat, ear pain/pressure, facial pain/pressure, and wheezing. She denies fever and shortness of breath. Onset of symptoms was 5 days ago, gradually worsening since that time. She is drinking plenty of fluids. Evaluation to date: at home COVID test negative. Treatment to date: antihistamines, albuterol inhaler and Zyrtec-D.  She states she is needing albuterol 2-3 times per day since she has been sick; she was not even using it daily previously.  She has a history of asthma. She does not smoke.     ROS: Negative unless specifically indicated above in HPI.   Relevant past medical history reviewed and updated as indicated.   Allergies and medications reviewed and updated.   Current Outpatient Medications:    albuterol (VENTOLIN HFA) 108 (90 Base) MCG/ACT inhaler, INHALE TWO PUFFS BY MOUTH INTO THE LUNGS EVERY SIX HOURS AS NEEDED FOR WHEEZING/SHORTNESS OF BREATH, Disp: 8.5 g, Rfl: 0   cholecalciferol (VITAMIN D3) 25 MCG (1000 UNIT) tablet, Take 1,000 Units by mouth daily., Disp: , Rfl:    escitalopram (LEXAPRO) 20 MG tablet, Take 20 mg  by mouth daily., Disp: , Rfl:    fexofenadine (ALLEGRA) 180 MG tablet, Take 180 mg by mouth daily., Disp: , Rfl:    omeprazole (PRILOSEC OTC) 20 MG tablet, Take 20 mg by mouth 2 (two) times daily., Disp: , Rfl:    valACYclovir (VALTREX) 500 MG tablet, Take 1 tablet by mouth daily., Disp: , Rfl:    benzonatate (TESSALON) 100 MG capsule, Take 1 capsule (100 mg total) by mouth 3 (three) times daily as needed for cough. (Patient not taking: Reported on 03/22/2023), Disp: 30 capsule, Rfl: 0   meclizine (ANTIVERT) 25 MG tablet, Take 1 tablet (25 mg total) by mouth 3 (three) times daily as needed for dizziness. (Patient not taking: Reported on 03/22/2023), Disp: 30 tablet, Rfl: 1   ondansetron (ZOFRAN) 4 MG tablet, Take 1 tablet (4 mg total) by mouth every 8 (eight) hours as needed for nausea or vomiting. (Patient not taking: Reported on 03/22/2023), Disp: 20 tablet, Rfl: 0  Allergies  Allergen Reactions   Clindamycin/Lincomycin     C. diff   Codeine Phosphate     REACTION: unspecified   Hydrocodone Nausea And Vomiting   Methylprednisolone     A local reaction to DepoMedrol    Objective:   BP 124/78   Pulse 78   Temp 98.6 F (37 C)   Ht 5\' 3"  (1.6 m)   Wt 187 lb (84.8 kg)   SpO2 98%   BMI 33.13 kg/m    Physical Exam Vitals reviewed.  Constitutional:      General: She is not in  acute distress.    Appearance: Normal appearance. She is not ill-appearing, toxic-appearing or diaphoretic.  HENT:     Head: Normocephalic and atraumatic.     Right Ear: Tympanic membrane, ear canal and external ear normal. There is no impacted cerumen.     Left Ear: Tympanic membrane, ear canal and external ear normal. There is no impacted cerumen.     Nose: Congestion present. No rhinorrhea.     Right Sinus: No maxillary sinus tenderness or frontal sinus tenderness.     Left Sinus: No maxillary sinus tenderness or frontal sinus tenderness.     Mouth/Throat:     Mouth: Mucous membranes are moist.      Pharynx: Oropharynx is clear. Posterior oropharyngeal erythema present. No oropharyngeal exudate.  Eyes:     General: No scleral icterus.       Right eye: No discharge.        Left eye: No discharge.     Conjunctiva/sclera: Conjunctivae normal.  Cardiovascular:     Rate and Rhythm: Normal rate and regular rhythm.     Heart sounds: Normal heart sounds. No murmur heard.    No friction rub. No gallop.  Pulmonary:     Effort: Pulmonary effort is normal. No respiratory distress.     Breath sounds: Normal breath sounds. No stridor. No wheezing, rhonchi or rales.  Musculoskeletal:        General: Normal range of motion.     Cervical back: Normal range of motion.  Lymphadenopathy:     Cervical: No cervical adenopathy.  Skin:    General: Skin is warm and dry.     Capillary Refill: Capillary refill takes less than 2 seconds.  Neurological:     General: No focal deficit present.     Mental Status: She is alert and oriented to person, place, and time. Mental status is at baseline.  Psychiatric:        Mood and Affect: Mood normal.        Behavior: Behavior normal.        Thought Content: Thought content normal.        Judgment: Judgment normal.

## 2023-12-01 ENCOUNTER — Other Ambulatory Visit: Payer: Self-pay | Admitting: Internal Medicine

## 2023-12-01 DIAGNOSIS — J45901 Unspecified asthma with (acute) exacerbation: Secondary | ICD-10-CM

## 2023-12-07 LAB — LAB REPORT - SCANNED
A1c: 5.7
EGFR: 61
TSH: 2.66

## 2024-01-06 LAB — LAB REPORT - SCANNED
Creatinine, POC: 1.08 mg/dL
EGFR: 59

## 2024-01-11 ENCOUNTER — Ambulatory Visit: Admitting: Internal Medicine

## 2024-01-11 ENCOUNTER — Encounter: Payer: Self-pay | Admitting: Internal Medicine

## 2024-01-11 VITALS — BP 102/68 | HR 79 | Temp 98.3°F | Ht 63.0 in | Wt 188.4 lb

## 2024-01-11 DIAGNOSIS — E785 Hyperlipidemia, unspecified: Secondary | ICD-10-CM | POA: Diagnosis not present

## 2024-01-11 DIAGNOSIS — E8881 Metabolic syndrome: Secondary | ICD-10-CM | POA: Insufficient documentation

## 2024-01-11 DIAGNOSIS — F419 Anxiety disorder, unspecified: Secondary | ICD-10-CM

## 2024-01-11 DIAGNOSIS — E6609 Other obesity due to excess calories: Secondary | ICD-10-CM

## 2024-01-11 DIAGNOSIS — R944 Abnormal results of kidney function studies: Secondary | ICD-10-CM | POA: Insufficient documentation

## 2024-01-11 DIAGNOSIS — E669 Obesity, unspecified: Secondary | ICD-10-CM | POA: Insufficient documentation

## 2024-01-11 DIAGNOSIS — E66811 Obesity, class 1: Secondary | ICD-10-CM | POA: Diagnosis not present

## 2024-01-11 DIAGNOSIS — Z6830 Body mass index (BMI) 30.0-30.9, adult: Secondary | ICD-10-CM

## 2024-01-11 MED ORDER — TIRZEPATIDE-WEIGHT MANAGEMENT 2.5 MG/0.5ML ~~LOC~~ SOLN: 2.5000 mg | SUBCUTANEOUS | 5 refills | Status: AC

## 2024-01-11 NOTE — Assessment & Plan Note (Addendum)
 Worse Lexapro  20 mg a day has probably contributed to weight gain Diet, exercise, good hydration, sleep discussed. Cut back on eating out, reduce sweets, try to drink water instead of diet [facility]

## 2024-01-11 NOTE — Assessment & Plan Note (Addendum)
 Mild GFR 59 Dr Leva referred pt to Nephrology Hydrate well Try to drink water instead of diet [facility]

## 2024-01-11 NOTE — Assessment & Plan Note (Addendum)
  Megan Medina can try Crestor per Dr. Mardelle advice

## 2024-01-11 NOTE — Progress Notes (Signed)
 Subjective:  Patient ID: Megan Medina, female    DOB: Jan 26, 1966  Age: 58 y.o. MRN: [id number]  CC: Medication Management (Review results (from OBGYN, concerns with creatinine and cholesterol levels). Questions about medication OBGYN is recommending (crestor 5mg ))   HPI Megan Medina presents for dyslipidemia, decreased GFR, GERD, depression, wt gain  Outpatient Medications Prior to Visit  Medication Sig Dispense Refill   albuterol  (VENTOLIN  HFA) 108 (90 Base) MCG/ACT inhaler INHALE TWO PUFFS BY MOUTH INTO THE LUNGS EVERY SIX HOURS AS NEEDED FOR WHEEZING/SHORTNESS OF BREATH 8.5 g 5   benzonatate  (TESSALON ) 200 MG capsule Take 1 capsule (200 mg total) by mouth 3 (three) times daily as needed for cough. 30 capsule 0   cholecalciferol (VITAMIN D3) 25 MCG (1000 UNIT) tablet Take 1,000 Units by mouth daily.     escitalopram  (LEXAPRO ) 20 MG tablet Take 20 mg by mouth daily.     fexofenadine (ALLEGRA) 180 MG tablet Take 180 mg by mouth daily.     omeprazole (PRILOSEC OTC) 20 MG tablet Take 20 mg by mouth 2 (two) times daily.     valACYclovir  (VALTREX ) 500 MG tablet Take 1 tablet by mouth daily.     meclizine  (ANTIVERT ) 25 MG tablet Take 1 tablet (25 mg total) by mouth 3 (three) times daily as needed for dizziness. (Patient not taking: Reported on 01/11/2024) 30 tablet 1   ondansetron  (ZOFRAN ) 4 MG tablet Take 1 tablet (4 mg total) by mouth every 8 (eight) hours as needed for nausea or vomiting. (Patient not taking: Reported on 01/11/2024) 20 tablet 0   No facility-administered medications prior to visit.    ROS: Review of Systems  Constitutional:  Positive for unexpected weight change. Negative for activity change, appetite change, chills and fatigue.  HENT:  Negative for congestion, mouth sores and sinus pressure.   Eyes:  Negative for visual disturbance.  Respiratory:  Negative for cough and chest tightness.   Gastrointestinal:  Negative for abdominal pain and nausea.   Genitourinary:  Negative for difficulty urinating, frequency and vaginal pain.  Musculoskeletal:  Negative for back pain and gait problem.  Skin:  Negative for pallor and rash.  Neurological:  Negative for dizziness, tremors, weakness, numbness and headaches.  Psychiatric/Behavioral:  Negative for confusion and sleep disturbance.     Objective:  BP 102/68   Pulse 79   Temp 98.3 F (36.8 C)   Ht 5' 3 (1.6 m)   Wt 188 lb 6.4 oz (85.5 kg)   SpO2 98%   BMI 33.37 kg/m   BP Readings from Last 3 Encounters:  01/11/24 102/68  03/22/23 124/78  03/18/22 124/80    Wt Readings from Last 3 Encounters:  01/11/24 188 lb 6.4 oz (85.5 kg)  03/22/23 187 lb (84.8 kg)  08/29/21 184 lb (83.5 kg)    Physical Exam Constitutional:      General: She is not in acute distress.    Appearance: She is well-developed. She is obese. She is not ill-appearing.  HENT:     Head: Normocephalic.     Right Ear: External ear normal.     Left Ear: External ear normal.     Nose: Nose normal.  Eyes:     General:        Right eye: No discharge.        Left eye: No discharge.     Conjunctiva/sclera: Conjunctivae normal.     Pupils: Pupils are equal, round, and reactive to light.  Neck:  Thyroid : No thyromegaly.     Vascular: No JVD.     Trachea: No tracheal deviation.  Cardiovascular:     Rate and Rhythm: Normal rate and regular rhythm.     Heart sounds: Normal heart sounds.  Pulmonary:     Effort: No respiratory distress.     Breath sounds: No stridor. No wheezing.  Abdominal:     General: Bowel sounds are normal. There is no distension.     Palpations: Abdomen is soft. There is no mass.     Tenderness: There is no abdominal tenderness. There is no guarding or rebound.  Musculoskeletal:        General: No tenderness.     Cervical back: Normal range of motion and neck supple. No rigidity.  Lymphadenopathy:     Cervical: No cervical adenopathy.  Skin:    Findings: No erythema or rash.   Neurological:     Mental Status: She is oriented to person, place, and time.     Cranial Nerves: No cranial nerve deficit.     Motor: No abnormal muscle tone.     Coordination: Coordination normal.     Gait: Gait normal.     Deep Tendon Reflexes: Reflexes normal.  Psychiatric:        Behavior: Behavior normal.        Thought Content: Thought content normal.        Judgment: Judgment normal.     Lab Results  Component Value Date   WBC 17.8 (H) 01/16/2017   HGB 13.5 01/16/2017   HCT 40.2 01/16/2017   PLT 326 01/16/2017   GLUCOSE 100 (H) 01/17/2017   ALT 16 01/15/2017   AST 15 01/15/2017   NA 137 01/17/2017   K 3.9 01/17/2017   CL 106 01/17/2017   CREATININE 0.65 01/17/2017   BUN 6 01/17/2017   CO2 24 01/17/2017   TSH 1.37 09/23/2010    No results found.  Assessment & Plan:   Problem List Items Addressed This Visit     Anxiety   On Lexapro       Decreased GFR   Mild GFR 59 Dr Leva referred pt to Nephrology Hydrate well Try to drink water instead of diet Mountain Dew      Dyslipidemia - Primary    Deitra can try Crestor per Dr. Mardelle advice      Metabolic syndrome   Discussed: 11/2023 A1c 5.7%; LDL 152 Zepbound if covered Diet, exercise, good hydration, sleep discussed. Cut back on eating out, reduce sweets, try to drink water instead of diet Mountain Dew      Obesity   Worse Lexapro  20 mg a day has probably contributed to weight gain Diet, exercise, good hydration, sleep discussed. Cut back on eating out, reduce sweets, try to drink water instead of diet [facility]      Relevant Medications   tirzepatide (ZEPBOUND) 2.5 MG/0.5ML injection vial      Meds ordered this encounter  Medications   tirzepatide (ZEPBOUND) 2.5 MG/0.5ML injection vial    Sig: Inject 2.5 mg into the skin once a week.    Dispense:  2 mL    Refill:  5      Follow-up: Return in about 3 months (around 04/12/2024) for a follow-up visit.  Marolyn Noel, MD

## 2024-01-11 NOTE — Assessment & Plan Note (Signed)
 On Lexapro

## 2024-01-11 NOTE — Assessment & Plan Note (Addendum)
 Discussed: 11/2023 A1c 5.7%; LDL 152 Zepbound if covered Diet, exercise, good hydration, sleep discussed. Cut back on eating out, reduce sweets, try to drink water instead of diet [facility]

## 2024-02-07 ENCOUNTER — Encounter: Payer: Self-pay | Admitting: Internal Medicine
